# Patient Record
Sex: Female | Born: 1980 | Race: White | Hispanic: No | Marital: Married | Smoking: Never smoker
Health system: Southern US, Community
[De-identification: ages and names within clinical notes are randomized; demographics above are authoritative.]

## PROBLEM LIST (undated history)

## (undated) ENCOUNTER — Inpatient Hospital Stay (HOSPITAL_COMMUNITY): Payer: Self-pay

## (undated) DIAGNOSIS — Z789 Other specified health status: Secondary | ICD-10-CM

## (undated) HISTORY — PX: INNER EAR SURGERY: SHX679

## (undated) HISTORY — PX: WISDOM TOOTH EXTRACTION: SHX21

## (undated) HISTORY — PX: TYMPANOSTOMY TUBE PLACEMENT: SHX32

---

## 2004-05-29 HISTORY — PX: CERVICAL CONE BIOPSY: SUR198

## 2006-07-16 ENCOUNTER — Ambulatory Visit (HOSPITAL_COMMUNITY): Admission: RE | Admit: 2006-07-16 | Discharge: 2006-07-16 | Payer: Self-pay | Admitting: *Deleted

## 2006-07-16 ENCOUNTER — Encounter (INDEPENDENT_AMBULATORY_CARE_PROVIDER_SITE_OTHER): Payer: Self-pay | Admitting: Specialist

## 2009-06-08 ENCOUNTER — Inpatient Hospital Stay (HOSPITAL_COMMUNITY): Admission: AD | Admit: 2009-06-08 | Discharge: 2009-07-22 | Payer: Self-pay | Admitting: Obstetrics

## 2009-06-08 ENCOUNTER — Encounter: Payer: Self-pay | Admitting: Obstetrics

## 2009-06-22 ENCOUNTER — Encounter: Payer: Self-pay | Admitting: Obstetrics

## 2009-07-06 ENCOUNTER — Ambulatory Visit (HOSPITAL_COMMUNITY): Admission: RE | Admit: 2009-07-06 | Discharge: 2009-07-06 | Payer: Self-pay | Admitting: Obstetrics

## 2009-07-20 ENCOUNTER — Encounter: Payer: Self-pay | Admitting: Obstetrics

## 2009-08-12 ENCOUNTER — Observation Stay (HOSPITAL_COMMUNITY): Admission: AD | Admit: 2009-08-12 | Discharge: 2009-08-13 | Payer: Self-pay | Admitting: Obstetrics and Gynecology

## 2009-09-09 ENCOUNTER — Inpatient Hospital Stay (HOSPITAL_COMMUNITY): Admission: AD | Admit: 2009-09-09 | Discharge: 2009-09-11 | Payer: Self-pay | Admitting: Obstetrics and Gynecology

## 2010-08-14 LAB — URINALYSIS, MICROSCOPIC ONLY
Bilirubin Urine: NEGATIVE
Nitrite: NEGATIVE
Protein, ur: NEGATIVE mg/dL
Urobilinogen, UA: 0.2 mg/dL (ref 0.0–1.0)
pH: 6.5 (ref 5.0–8.0)

## 2010-08-14 LAB — CBC
HCT: 36.6 % (ref 36.0–46.0)
HCT: 34.7 % — ABNORMAL LOW (ref 36.0–46.0)
MCHC: 33.4 g/dL (ref 30.0–36.0)
MCHC: 34 g/dL (ref 30.0–36.0)
RBC: 3.64 MIL/uL — ABNORMAL LOW (ref 3.87–5.11)

## 2010-08-14 LAB — DIFFERENTIAL
Eosinophils Relative: 1 % (ref 0–5)
Lymphs Abs: 2 10*3/uL (ref 0.7–4.0)
Monocytes Absolute: 0.5 10*3/uL (ref 0.1–1.0)
Monocytes Relative: 5 % (ref 3–12)
Neutrophils Relative %: 76 % (ref 43–77)

## 2010-08-14 LAB — FETAL FIBRONECTIN: Fetal Fibronectin: NEGATIVE

## 2010-08-14 LAB — STREP B DNA PROBE: Strep Group B Ag: NEGATIVE

## 2010-08-14 LAB — URINE CULTURE: Culture: NO GROWTH

## 2010-08-16 LAB — RH IMMUNE GLOB WKUP(>/=20WKS)(NOT WOMEN'S HOSP): Fetal Screen: NEGATIVE

## 2010-08-16 LAB — CBC
HCT: 32.4 % — ABNORMAL LOW (ref 36.0–46.0)
MCHC: 35.1 g/dL (ref 30.0–36.0)
MCV: 95.2 fL (ref 78.0–100.0)
Platelets: 92 10*3/uL — ABNORMAL LOW (ref 150–400)

## 2010-08-17 LAB — URINALYSIS, ROUTINE W REFLEX MICROSCOPIC
Bilirubin Urine: NEGATIVE
Hgb urine dipstick: NEGATIVE
Specific Gravity, Urine: 1.015 (ref 1.005–1.030)
pH: 7 (ref 5.0–8.0)

## 2010-08-17 LAB — RPR: RPR Ser Ql: NONREACTIVE

## 2010-08-17 LAB — CBC
HCT: 33.2 % — ABNORMAL LOW (ref 36.0–46.0)
Hemoglobin: 11.3 g/dL — ABNORMAL LOW (ref 12.0–15.0)
MCV: 95.1 fL (ref 78.0–100.0)
Platelets: 104 10*3/uL — ABNORMAL LOW (ref 150–400)
RBC: 4.12 MIL/uL (ref 3.87–5.11)
RBC: 3.5 MIL/uL — ABNORMAL LOW (ref 3.87–5.11)
WBC: 10.4 10*3/uL (ref 4.0–10.5)
WBC: 11.1 10*3/uL — ABNORMAL HIGH (ref 4.0–10.5)

## 2010-08-17 LAB — TYPE AND SCREEN
ABO/RH(D): A NEG
Antibody Screen: POSITIVE
DAT, IgG: NEGATIVE

## 2010-08-17 LAB — RH IMMUNE GLOBULIN WORKUP (NOT WOMEN'S HOSP)
ABO/RH(D): A NEG
Antibody Screen: POSITIVE
DAT, IgG: NEGATIVE

## 2010-08-17 LAB — URINE CULTURE
Colony Count: NO GROWTH
Special Requests: NEGATIVE

## 2010-08-21 LAB — DIFFERENTIAL
Basophils Absolute: 0 10*3/uL (ref 0.0–0.1)
Eosinophils Absolute: 0.1 10*3/uL (ref 0.0–0.7)
Lymphocytes Relative: 13 % (ref 12–46)
Monocytes Absolute: 1 10*3/uL (ref 0.1–1.0)
Neutrophils Relative %: 77 % (ref 43–77)

## 2010-08-21 LAB — CBC
HCT: 38.7 % (ref 36.0–46.0)
Hemoglobin: 13 g/dL (ref 12.0–15.0)
MCHC: 33.4 g/dL (ref 30.0–36.0)
MCV: 94.4 fL (ref 78.0–100.0)
MCV: 95.6 fL (ref 78.0–100.0)
Platelets: 152 10*3/uL (ref 150–400)
Platelets: 159 10*3/uL (ref 150–400)
RBC: 3.79 MIL/uL — ABNORMAL LOW (ref 3.87–5.11)
RDW: 13 % (ref 11.5–15.5)
RDW: 13 % (ref 11.5–15.5)
WBC: 12.5 10*3/uL — ABNORMAL HIGH (ref 4.0–10.5)

## 2010-08-21 LAB — URINE CULTURE: Special Requests: NEGATIVE

## 2010-08-21 LAB — TYPE AND SCREEN
ABO/RH(D): A NEG
Antibody Screen: POSITIVE

## 2010-08-21 LAB — URINALYSIS, DIPSTICK ONLY
Bilirubin Urine: NEGATIVE
Nitrite: NEGATIVE
Specific Gravity, Urine: 1.015 (ref 1.005–1.030)
pH: 5.5 (ref 5.0–8.0)

## 2010-08-21 LAB — STREP B DNA PROBE

## 2010-10-14 NOTE — Op Note (Signed)
NAME:  [name], [name]               ACCOUNT NO.:  [id number]   MEDICAL RECORD NO.:  [id number]          PATIENT TYPE:  AMB   LOCATION:  SDC                           FACILITY:  [facility]   PHYSICIAN:  Pe Ell B. Earlene Plater, M.D.  DATE OF BIRTH:  [birth year]   DATE OF PROCEDURE:  07/16/2006  DATE OF DISCHARGE:                               OPERATIVE REPORT   PREOPERATIVE DIAGNOSIS:  Adenocarcinoma in situ of the cervix.   POSTOPERATIVE DIAGNOSIS:  Adenocarcinoma in situ of the cervix.   PROCEDURE:  Cold knife conization of the cervix with endocervical  curettage.   SURGEON:  Chester Holstein. Earlene Plater, M.D.   ASSISTANT:  None.   ANESTHESIA:  LMA general.   SPECIMENS:  Cervical cone and endocervical curettage to pathology.   BLOOD LOSS:  Minimal.   COMPLICATIONS:  None.   INDICATIONS FOR PROCEDURE:  A 30 year old white female gravida 0 who had  an ascus-high grade on Pap.  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  Patient presents for surgical  treatment.  Risks of surgery discussed including infection, bleeding,  damage to surrounding organs and potential increased risk for preterm  birth in the future.  Did discuss with patient recommendations against a  LEEP given the adenocarcinoma in situ concerns, want to avoid thermal  damage.  Patient in agreement.   DESCRIPTION OF PROCEDURE:  Patient taken to the operating room and LMA  anesthesia obtained.  She is prepped and draped in standard fashion.  Bladder emptied with in and out cath.   Weighted speculum inserted into the vagina and the cervix treated with  Lugol's solution.  The transition zone did not stain.  The remainder of  the cervix stained normally.  Stay sutures placed at 3 and 9 o'clock.  The uterus was sounded and a cone shaped specimen excised with knife and  curved scissors.  The specimen was tagged at 12 o'clock for  identification.  Gentle endocervical curettage performed, minimal tissue   obtained.   The cone bed was made hemostatic with ball cautery and Monsel's  solution.  Gelfoam was placed in the cone bed.   The instruments were removed and cervix hemostatic.  Patient tolerated  the procedure well with no complications.  She was taken to the recovery  room awake, alert and in stable condition.      Gerri Spore B. Earlene Plater, M.D.  Electronically Signed     WBD/MEDQ  D:  07/16/2006  T:  07/16/2006  Job:  [id number]

## 2010-11-22 ENCOUNTER — Other Ambulatory Visit: Payer: Self-pay | Admitting: Obstetrics and Gynecology

## 2013-05-29 NOTE — L&D Delivery Note (Signed)
Delivery Note At 11:36 AM a viable and healthy female was delivered via  (Presentation:LOA ).  APGAR: 8, 9; weight pending.  Placenta status:manually and intact.  Cord:  with the following complications: tight [place] x one reduced on perineum .  Cord pH: na  Anesthesia:  epidural Episiotomy: none Lacerations: none Suture Repair: na Est. Blood Loss (mL): 200  Mom to postpartum.  Baby to Couplet care / Skin to Skin.  Nihar Klus J 12/15/2013, 11:50 AM

## 2013-07-02 LAB — OB RESULTS CONSOLE HIV ANTIBODY (ROUTINE TESTING): HIV: NONREACTIVE

## 2013-07-02 LAB — OB RESULTS CONSOLE ABO/RH: RH Type: NEGATIVE

## 2013-07-02 LAB — OB RESULTS CONSOLE RPR: RPR: NONREACTIVE

## 2013-07-02 LAB — OB RESULTS CONSOLE RUBELLA ANTIBODY, IGM: Rubella: IMMUNE

## 2013-07-02 LAB — OB RESULTS CONSOLE ANTIBODY SCREEN: ANTIBODY SCREEN: POSITIVE

## 2013-07-02 LAB — OB RESULTS CONSOLE HEPATITIS B SURFACE ANTIGEN: Hepatitis B Surface Ag: NEGATIVE

## 2013-07-10 LAB — OB RESULTS CONSOLE GC/CHLAMYDIA
CHLAMYDIA, DNA PROBE: NEGATIVE
GC PROBE AMP, GENITAL: NEGATIVE

## 2013-10-19 ENCOUNTER — Inpatient Hospital Stay (HOSPITAL_COMMUNITY): Payer: BC Managed Care – PPO

## 2013-10-19 ENCOUNTER — Observation Stay (HOSPITAL_COMMUNITY)
Admission: AD | Admit: 2013-10-19 | Discharge: 2013-10-19 | Disposition: A | Discharge status: Home / Self Care | Payer: BC Managed Care – PPO | Source: Ambulatory Visit | Attending: Obstetrics and Gynecology | Admitting: Obstetrics and Gynecology

## 2013-10-19 ENCOUNTER — Encounter (HOSPITAL_COMMUNITY): Payer: Self-pay | Admitting: *Deleted

## 2013-10-19 DIAGNOSIS — O479 False labor, unspecified: Secondary | ICD-10-CM

## 2013-10-19 DIAGNOSIS — O26879 Cervical shortening, unspecified trimester: Principal | ICD-10-CM | POA: Insufficient documentation

## 2013-10-19 DIAGNOSIS — O4700 False labor before 37 completed weeks of gestation, unspecified trimester: Secondary | ICD-10-CM | POA: Insufficient documentation

## 2013-10-19 DIAGNOSIS — O26872 Cervical shortening, second trimester: Secondary | ICD-10-CM | POA: Diagnosis present

## 2013-10-19 LAB — FETAL FIBRONECTIN: Fetal Fibronectin: NEGATIVE

## 2013-10-19 LAB — URINALYSIS, ROUTINE W REFLEX MICROSCOPIC
BILIRUBIN URINE: NEGATIVE
GLUCOSE, UA: NEGATIVE mg/dL
Hgb urine dipstick: NEGATIVE
KETONES UR: NEGATIVE mg/dL
LEUKOCYTES UA: NEGATIVE
Nitrite: NEGATIVE
PH: 7.5 (ref 5.0–8.0)
Protein, ur: NEGATIVE mg/dL
Specific Gravity, Urine: 1.01 (ref 1.005–1.030)
Urobilinogen, UA: 0.2 mg/dL (ref 0.0–1.0)

## 2013-10-19 LAB — CBC
HCT: 36.8 % (ref 36.0–46.0)
HEMOGLOBIN: 12.6 g/dL (ref 12.0–15.0)
MCH: 31.3 pg (ref 26.0–34.0)
MCHC: 34.2 g/dL (ref 30.0–36.0)
MCV: 91.5 fL (ref 78.0–100.0)
PLATELETS: 146 10*3/uL — AB (ref 150–400)
RBC: 4.02 MIL/uL (ref 3.87–5.11)
RDW: 13 % (ref 11.5–15.5)
WBC: 10.3 10*3/uL (ref 4.0–10.5)

## 2013-10-19 MED ORDER — PRENATAL PLUS 27-1 MG PO TABS
1.0000 | ORAL_TABLET | Freq: Every day | ORAL | Status: DC
  Administered 2013-10-19: 1 via ORAL
  Filled 2013-10-19: qty 1

## 2013-10-19 MED ORDER — ACETAMINOPHEN 325 MG PO TABS: 650.0000 mg | ORAL_TABLET | ORAL | Status: DC | PRN

## 2013-10-19 MED ORDER — LACTATED RINGERS IV BOLUS (SEPSIS): 500.0000 mL | Freq: Once | INTRAVENOUS | Status: DC

## 2013-10-19 MED ORDER — ZOLPIDEM TARTRATE 5 MG PO TABS: 5.0000 mg | ORAL_TABLET | Freq: Every evening | ORAL | Status: DC | PRN

## 2013-10-19 MED ORDER — NIFEDIPINE 10 MG PO CAPS
10.0000 mg | ORAL_CAPSULE | Freq: Four times a day (QID) | ORAL | Status: DC
  Filled 2013-10-19: qty 1

## 2013-10-19 MED ORDER — LACTATED RINGERS IV SOLN
INTRAVENOUS | Status: DC
  Administered 2013-10-19 (×2): via INTRAVENOUS

## 2013-10-19 MED ORDER — DOCUSATE SODIUM 100 MG PO CAPS
100.0000 mg | ORAL_CAPSULE | Freq: Every day | ORAL | Status: DC
  Filled 2013-10-19 (×2): qty 1

## 2013-10-19 MED ORDER — PROGESTERONE MICRONIZED 200 MG PO CAPS
200.0000 mg | ORAL_CAPSULE | Freq: Every day | ORAL | Status: DC
  Administered 2013-10-19: 200 mg via VAGINAL
  Filled 2013-10-19 (×2): qty 1

## 2013-10-19 MED ORDER — CALCIUM CARBONATE ANTACID 500 MG PO CHEW
2.0000 | CHEWABLE_TABLET | ORAL | Status: DC | PRN
  Filled 2013-10-19: qty 2

## 2013-10-19 MED ORDER — NIFEDIPINE 10 MG PO CAPS
20.0000 mg | ORAL_CAPSULE | Freq: Once | ORAL | Status: AC
  Administered 2013-10-19: 20 mg via ORAL
  Filled 2013-10-19: qty 2

## 2013-10-19 MED ORDER — NIFEDIPINE 10 MG PO CAPS
10.0000 mg | ORAL_CAPSULE | Freq: Four times a day (QID) | ORAL | Status: DC
Start: 2013-10-19 — End: 2013-12-14

## 2013-10-19 NOTE — MAU Provider Note (Signed)
Reviewed and agree.

## 2013-10-19 NOTE — MAU Note (Signed)
Reports contractions that started around 4 pm, about 4 UC an hour until 10-11pm, then had 6 UC in 30 minutes. Hx of shorten cervical length, 2.3cm on Wednesday. Taking progesterone suppositories. +FM. Denies vaginal bleeding, abnormal discharge and urinary symptoms.

## 2013-10-19 NOTE — H&P (Signed)
Will defer BMZ due to early gestational age/less pulm tissue. Will do if cervical issue worsens. Also defer Magnesium sulfate for neuroprotection due to contractions pattern are not close but observation warranted due to cervical funneling

## 2013-10-19 NOTE — MAU Provider Note (Signed)
  History     CSN: [id number]  Arrival date and time: 10/19/13 0004 RN notified provider of pt arrival @0034  Provider at bedside @0155     Chief Complaint  Patient presents with  . Contractions   HPI Comments: [id number] @25 .6 wks c/o ctx since 1600 today. Ctx became more regular @2300 , 4-5 over 10 min. No LOF or VB. Denies sexual intercourse. Admits to increased activity since early this am. Pregnancy complicated by cervical shortening @21  wks, last cervical length 4 days ago 2.5cm w/o funneling. Taking vaginal Prometrium.   OB History   Grav Para Term Preterm Abortions TAB SAB Ect Mult Living   4 2 2  0 1  1  1 3       History reviewed. No pertinent past medical history.  Past Surgical History  Procedure Laterality Date  . Wisdom tooth extraction    . Cervical cone biopsy  2006  . Tympanostomy tube placement      History reviewed. No pertinent family history.  History  Substance Use Topics  . Smoking status: Never Smoker   . Smokeless tobacco: Not on file  . Alcohol Use: No    Allergies: No Known Allergies  Prescriptions prior to admission  Medication Sig Dispense Refill  . Prenatal Vit-Fe Fumarate-FA (MULTIVITAMIN-PRENATAL) 27-0.8 MG TABS tablet Take 1 tablet by mouth daily at 12 noon.      . progesterone (PROMETRIUM) 200 MG capsule Place 200 mg vaginally at bedtime.        Review of Systems  Constitutional: Negative.   HENT: Negative.   Eyes: Negative.   Respiratory: Negative.   Cardiovascular: Negative.   Gastrointestinal: Negative.   Genitourinary: Negative for dysuria, urgency, frequency, hematuria and flank pain.       Good FM.  No LOF or VB. +ctx  Musculoskeletal: Negative.   Skin: Negative.   Neurological: Negative.   Endo/Heme/Allergies: Negative.   Psychiatric/Behavioral: Negative.    Physical Exam   Blood pressure 121/59, pulse 72, temperature 98.2 F (36.8 C), temperature source Oral, resp. rate 18, height 5\' 7"  (1.702 m), weight 71.668 kg  (158 lb), SpO2 99.00%.  Physical Exam  Constitutional: She is oriented to person, place, and time. She appears well-developed and well-nourished.  HENT:  Head: Normocephalic.  Neck: Normal range of motion.  Cardiovascular: Normal rate and regular rhythm.   Respiratory: Effort normal and breath sounds normal.  GI: Soft. Bowel sounds are normal.  Genitourinary: Vagina normal.  SVE 0/50/-3  Musculoskeletal: Normal range of motion.  Neurological: She is alert and oriented to person, place, and time.  Skin: Skin is warm and dry.  Psychiatric: She has a normal mood and affect.  EFM: 125 bpm, mod variability, +accels, no decels Toco: q15   UA: WNL FFN:Neg MAU Course  Procedures   Assessment and Plan  25.[redacted] weeks gestation Cervical shortening with funneling Preterm contractions  Observation to Ante, Procardia q6 hrs, continue Prometrium, IV hydration, monitor uterine activity. Discussed assessment and plan with Dr. Cherly Hensen.   Donette Larry, CNM 10/19/13, [phone]

## 2013-10-19 NOTE — Progress Notes (Signed)
Very active fetus--can visualize fetal movement--tracing maternal HR at times

## 2013-10-19 NOTE — Progress Notes (Signed)
HD #2  25 6/7 week  S; notes some mild ctx  O: VS BP 112/55 T98.4 Lungs: clear to A Abdomen: gravid, nontender Extr: no edema or calf tenderness VE: flared cervix/2 cm/-3 vtx  Tracing: baseline 130-135 (+) accels Initial run of ctx then rare  IMP: preterm cervical shortening IUP @ 25 6/7 PTL P) cont procardia. Cont vaginal progesterone suppository. PTL prec/ OOW until appt this week with Dr Billy Coast Repeat sono nv Bedrest, d/c home

## 2013-10-19 NOTE — Discharge Summary (Signed)
Obstetric Discharge Summary Reason for Admission: observation/evaluation and PMC, shortened cervix, IUP @ 25 5/7 weeks Discharge diagnosis: PMC, PTL, IUP @ 25 6/7 weeks undelivered Prenatal Procedures: ultrasound Intrapartum Procedures: none Postpartum Procedures: n/a Complications-Operative and Postpartum: n/a Hemoglobin  Date Value Ref Range Status  10/19/2013 12.6  12.0 - 15.0 g/dL Final     HCT  Date Value Ref Range Status  10/19/2013 36.8  36.0 - 46.0 % Final    Physical Exam:  General: alert, cooperative and no distress Lungs clear to A Abdomen: gravid nontender Pelvic; closed/flared/2 cm firm/pp out of pelvis Extr: no edema  Hospital course: pt was placed under observation due to funnelling in setting of shortened cervix on sonogram. Pt was started on procardia. She was continued on vaginal progesterone. Pt was placed on continuous fetal monitoring.  She was discharged home on bed rest. Reactive NST noted. FFN neg.  Discharge Diagnoses: Premature labor and IUP@ 25 6/7 weeks  Discharge Information: Date: 10/19/2013 Activity: pelvic rest Diet: routine Medications: PNV and procardia, continue vaginal progesterone Condition: stable Instructions: PTL precautions, bed rest, OOW until reevaluation Discharge to: home Follow-up Information   Follow up with Lenoard Aden, MD On 10/21/2013.   Specialty:  Obstetrics and Gynecology   Contact information:   [address] [place] [address] [phone]       Newborn Data: Undelivered  [name] 10/19/2013, 12:09 PM

## 2013-10-19 NOTE — Discharge Instructions (Signed)
Preterm Labor Information Preterm labor is when labor starts at less than 37 weeks of pregnancy. The normal length of a pregnancy is 39 to 41 weeks. CAUSES Often, there is no identifiable underlying cause as to why a woman goes into preterm labor. One of the most common known causes of preterm labor is infection. Infections of the uterus, cervix, vagina, amniotic sac, bladder, kidney, or even the lungs (pneumonia) can cause labor to start. Other suspected causes of preterm labor include:   Urogenital infections, such as yeast infections and bacterial vaginosis.   Uterine abnormalities (uterine shape, uterine septum, fibroids, or bleeding from the placenta).   A cervix that has been operated on (it may fail to stay closed).   Malformations in the fetus.   Multiple gestations (twins, triplets, and so on).   Breakage of the amniotic sac.  RISK FACTORS  Having a previous history of preterm labor.   Having premature rupture of membranes (PROM).   Having a placenta that covers the opening of the cervix (placenta previa).   Having a placenta that separates from the uterus (placental abruption).   Having a cervix that is too weak to hold the fetus in the uterus (incompetent cervix).   Having too much fluid in the amniotic sac (polyhydramnios).   Taking illegal drugs or smoking while pregnant.   Not gaining enough weight while pregnant.   Being younger than 30 and older than 32 years old.   Having a low socioeconomic status.   Being African American. SYMPTOMS Signs and symptoms of preterm labor include:   Menstrual-like cramps, abdominal pain, or back pain.  Uterine contractions that are regular, as frequent as six in an hour, regardless of their intensity (may be mild or painful).  Contractions that start on the top of the uterus and spread down to the lower abdomen and back.   A sense of increased pelvic pressure.   A watery or bloody mucus discharge that  comes from the vagina.  TREATMENT Depending on the length of the pregnancy and other circumstances, your health care provider may suggest bed rest. If necessary, there are medicines that can be given to stop contractions and to mature the fetal lungs. If labor happens before 34 weeks of pregnancy, a prolonged hospital stay may be recommended. Treatment depends on the condition of both you and the fetus.  WHAT SHOULD YOU DO IF YOU THINK YOU ARE IN PRETERM LABOR? Call your health care provider right away. You will need to go to the hospital to get checked immediately. HOW CAN YOU PREVENT PRETERM LABOR IN FUTURE PREGNANCIES? You should:   Stop smoking if you smoke.  Maintain healthy weight gain and avoid chemicals and drugs that are not necessary.  Be watchful for any type of infection.  Inform your health care provider if you have a known history of preterm labor. Document Released: 08/05/2003 Document Revised: 01/15/2013 Document Reviewed: 06/17/2012 [facility] Patient Information 2014 [place], [place].  Pelvic Rest Pelvic rest is sometimes recommended for women when:   The placenta is partially or completely covering the opening of the cervix (placenta previa).  There is bleeding between the uterine wall and the amniotic sac in the first trimester (subchorionic hemorrhage).  The cervix begins to open without labor starting (incompetent cervix, cervical insufficiency).  The labor is too early (preterm labor). HOME CARE INSTRUCTIONS  Do not have sexual intercourse, stimulation, or an orgasm.  Do not use tampons, douche, or put anything in the vagina.  Do not  lift anything over 10 pounds (4.5 kg).  Avoid strenuous activity or straining your pelvic muscles. SEEK MEDICAL CARE IF:  You have any vaginal bleeding during pregnancy. Treat this as a potential emergency.  You have cramping pain felt low in the stomach (stronger than menstrual cramps).  You notice vaginal discharge  (watery, mucus, or bloody).  You have a low, dull backache.  There are regular contractions or uterine tightening. SEEK IMMEDIATE MEDICAL CARE IF: You have vaginal bleeding and have placenta previa.  Document Released: 09/09/2010 Document Revised: 08/07/2011 Document Reviewed: 09/09/2010 [facility] Patient Information 2014 [place], [place].  Fetal Fibronectin This is a test done to help evaluate a pregnant woman's risk of pre-term delivery. It is generally done when you are 26 to [redacted] weeks pregnant and are having symptoms of premature labor. A Dacron swab is used to take a sample of cervical or vaginal fluid from the back portion of the vagina or from the area just outside the opening of the cervix. Fetal fibronectin (fFN) is a glycoprotein that can be used to help predict the short term risk of premature delivery. fFN is produced at the boundary between the amniotic sac and the lining of the mother's uterus. This is called the unteroplacental junction. Fetal fibronectin is largely confined to this junction and thought to help maintain the integrity of the boundary. fFN is normally detectable in cervicovaginal fluid during the first 20 to 24 weeks of pregnancy, and then is detectable again after about 36 weeks.  Finding fFN in cervicovaginal fluids after 36 weeks is not unusual as it is often released by the body as it gets ready for childbirth. The elevated fFN found in vaginal fluids early in pregnancy may simply reflect the normal growth and establishment of tissues at the unteroplacental junction with levels falling when this phase is complete. What is known is that fFN that is detected between 24 and 36 weeks of pregnancy is not normal. Elevated levels reflect a disturbance at the uteroplacental junction and have been associated with an increased risk of pre-term labor and delivery. Knowing whether or not a woman is likely to deliver prematurely helps your caregiver plan a course of action. The fFN  test is a relatively non-invasive tool to help the caregiver to distinguish between those who are likely to deliver shortly and those who are not.  PREPARATION FOR TEST   Inform the person conducting the test if you have a medical condition or are using any medications that cause excessive bleeding.  Do not have sexual intercourse for 24 hours before the procedure. NORMAL FINDINGS  Pregnancy = 50 nanograms/ml Ranges for normal findings may vary among different laboratories and hospitals. You should always check with your doctor after having lab work or other tests done to discuss the meaning of your test results and whether your values are considered within normal limits. MEANING OF TEST  Your caregiver will go over the test results with you and discuss the importance and meaning of your results, as well as treatment options and the need for additional tests if necessary. OBTAINING THE TEST RESULTS  It is your responsibility to obtain your test results. Ask the lab or department performing the test when and how you will get your results. Document Released: 03/16/2004 Document Revised: 08/07/2011 Document Reviewed: 04/24/2008 [facility] Patient Information 2014 [place], [place].

## 2013-10-19 NOTE — Progress Notes (Signed)
Bhambri CNM notified patient arrived with c/o of UC since 4pm. Hx of shorten cervical length, 2.3cm on Wednesday. Urinalysis processing. Orders received to PO hydrate, collect FFN and send to u/s for cervical length.

## 2013-10-19 NOTE — H&P (Signed)
  OB ADMISSION/ HISTORY & PHYSICAL:  Admission Date: 10/19/2013 12:04 AM  Admit Diagnosis: 25.[redacted] weeks gestation, cervical shortening, preterm contractions  [name] is a 33 y.o. female [phone] presenting with ctx since 1600 today. Ctx became more regular @2300 , 4-5 over 10 min. No LOF or VB. Denies sexual intercourse. Admits to increased activity since early this am.  .  Prenatal History: [id number]   EDC:01/26/2014, by Other Basis   Prenatal care at [facility]  Primary Ob Provider: Dr. Billy Coast Prenatal course complicated by first trimester VB with large [facility] resolved by 20 wks, cervical shortening @21  wks, last CL 4 days ago 2.5cm w/o funneling, on vaginal Prometrium. H/o CKC 2008, cervical shortening with last pregnancy-SVD @37  wks (twins)  Prenatal Labs: ABO, Rh:   A Neg Antibody:  Neg Rubella:   Immune RPR:   NR HBsAg:   Neg HIV:   Neg GBS:   n/a 1 hr GTT : n/a  Medical / Surgical History :  Past medical history: History reviewed. No pertinent past medical history.   Past surgical history:  Past Surgical History  Procedure Laterality Date  . Wisdom tooth extraction    . Cervical cone biopsy  2006  . Tympanostomy tube placement      Family History: History reviewed. No pertinent family history.   Social History:  reports that she has never smoked. She does not have any smokeless tobacco history on file. She reports that she does not drink alcohol or use illicit drugs.  Allergies: Review of patient's allergies indicates no known allergies.   Current Medications at time of admission:  Prior to Admission medications   Medication Sig Start Date End Date Taking? Authorizing Provider  Prenatal Vit-Fe Fumarate-FA (MULTIVITAMIN-PRENATAL) 27-0.8 MG TABS tablet Take 1 tablet by mouth daily at 12 noon.   Yes Historical Provider, MD  progesterone (PROMETRIUM) 200 MG capsule Place 200 mg vaginally at bedtime.   Yes Historical Provider, MD     Review of  Systems: +FM +ctx No LOF No VB  Physical Exam:  VS: Blood pressure 121/59, pulse 72, temperature 98.2 F (36.8 C), temperature source Oral, resp. rate 18, height 5\' 7"  (1.702 m), weight 71.668 kg (158 lb), SpO2 99.00%.  General: alert and oriented, appears comfortable Heart: RRR Lungs: Clear lung fields Abdomen: Gravid, soft and non-tender, non-distended  Extremities: no edema Genitalia / VE: 0/50/-3  FHR: baseline rate 125 / variability mod / accelerations + / no decelerations TOCO: q15   Assessment: 25.[redacted] weeks gestation Cervical shortening with funneling Preterm contractions FHR category I   Plan:  Observation, IV hydration, Procardia, continue Prometrium, monitor uterine activity. Dr. Cherly Hensen notified of admission / plan of care   Lawernce Pitts MSN, CNM 10/19/2013, 2:54 AM

## 2013-12-14 ENCOUNTER — Inpatient Hospital Stay (HOSPITAL_COMMUNITY)
Admission: AD | Admit: 2013-12-14 | Discharge: 2013-12-17 | DRG: 775 | Disposition: A | Discharge status: Home / Self Care | Payer: BC Managed Care – PPO | Source: Ambulatory Visit | Attending: Obstetrics and Gynecology | Admitting: Obstetrics and Gynecology

## 2013-12-14 ENCOUNTER — Encounter (HOSPITAL_COMMUNITY): Payer: Self-pay | Admitting: *Deleted

## 2013-12-14 ENCOUNTER — Inpatient Hospital Stay (HOSPITAL_COMMUNITY): Payer: BC Managed Care – PPO

## 2013-12-14 DIAGNOSIS — O4290 Premature rupture of membranes, unspecified as to length of time between rupture and onset of labor, unspecified weeks of gestation: Principal | ICD-10-CM | POA: Diagnosis present

## 2013-12-14 DIAGNOSIS — O42919 Preterm premature rupture of membranes, unspecified as to length of time between rupture and onset of labor, unspecified trimester: Secondary | ICD-10-CM | POA: Diagnosis present

## 2013-12-14 HISTORY — DX: Other specified health status: Z78.9

## 2013-12-14 LAB — CBC
HCT: 37.3 % (ref 36.0–46.0)
HEMOGLOBIN: 12.8 g/dL (ref 12.0–15.0)
MCH: 31.6 pg (ref 26.0–34.0)
MCHC: 34.3 g/dL (ref 30.0–36.0)
MCV: 92.1 fL (ref 78.0–100.0)
Platelets: 162 10*3/uL (ref 150–400)
RBC: 4.05 MIL/uL (ref 3.87–5.11)
RDW: 13.4 % (ref 11.5–15.5)
WBC: 13.4 10*3/uL — ABNORMAL HIGH (ref 4.0–10.5)

## 2013-12-14 LAB — OB RESULTS CONSOLE GBS: GBS: NEGATIVE

## 2013-12-14 LAB — GROUP B STREP BY PCR: GROUP B STREP BY PCR: NEGATIVE

## 2013-12-14 MED ORDER — ZOLPIDEM TARTRATE 5 MG PO TABS: 5.0000 mg | ORAL_TABLET | Freq: Every evening | ORAL | Status: DC | PRN

## 2013-12-14 MED ORDER — CITRIC ACID-SODIUM CITRATE 334-500 MG/5ML PO SOLN
30.0000 mL | ORAL | Status: DC | PRN
Start: 2013-12-14 — End: 2013-12-15

## 2013-12-14 MED ORDER — FLEET ENEMA 7-19 GM/118ML RE ENEM: 1.0000 | ENEMA | RECTAL | Status: DC | PRN

## 2013-12-14 MED ORDER — OXYCODONE-ACETAMINOPHEN 5-325 MG PO TABS: 1.0000 | ORAL_TABLET | ORAL | Status: DC | PRN

## 2013-12-14 MED ORDER — LACTATED RINGERS IV SOLN: 500.0000 mL | INTRAVENOUS | Status: DC | PRN

## 2013-12-14 MED ORDER — DIPHENHYDRAMINE HCL 50 MG/ML IJ SOLN: 12.5000 mg | INTRAMUSCULAR | Status: DC | PRN

## 2013-12-14 MED ORDER — LIDOCAINE HCL (PF) 1 % IJ SOLN
30.0000 mL | INTRAMUSCULAR | Status: DC | PRN
  Filled 2013-12-14: qty 30

## 2013-12-14 MED ORDER — FENTANYL 2.5 MCG/ML BUPIVACAINE 1/10 % EPIDURAL INFUSION (WH - ANES)
14.0000 mL/h | INTRAMUSCULAR | Status: DC | PRN
  Administered 2013-12-15: 14 mL/h via EPIDURAL

## 2013-12-14 MED ORDER — TERBUTALINE SULFATE 1 MG/ML IJ SOLN: 0.2500 mg | Freq: Once | INTRAMUSCULAR | Status: AC | PRN

## 2013-12-14 MED ORDER — LACTATED RINGERS IV SOLN
500.0000 mL | Freq: Once | INTRAVENOUS | Status: AC
  Administered 2013-12-15: 1000 mL via INTRAVENOUS

## 2013-12-14 MED ORDER — PHENYLEPHRINE 40 MCG/ML (10ML) SYRINGE FOR IV PUSH (FOR BLOOD PRESSURE SUPPORT)
80.0000 ug | PREFILLED_SYRINGE | INTRAVENOUS | Status: DC | PRN
  Filled 2013-12-14: qty 2

## 2013-12-14 MED ORDER — LACTATED RINGERS IV SOLN
INTRAVENOUS | Status: DC
  Administered 2013-12-14 – 2013-12-15 (×3): via INTRAVENOUS

## 2013-12-14 MED ORDER — OXYTOCIN 40 UNITS IN LACTATED RINGERS INFUSION - SIMPLE MED
1.0000 m[IU]/min | INTRAVENOUS | Status: DC
  Administered 2013-12-15: 2 m[IU]/min via INTRAVENOUS
  Filled 2013-12-14: qty 1000

## 2013-12-14 MED ORDER — ACETAMINOPHEN 325 MG PO TABS: 650.0000 mg | ORAL_TABLET | ORAL | Status: DC | PRN

## 2013-12-14 MED ORDER — IBUPROFEN 600 MG PO TABS
600.0000 mg | ORAL_TABLET | Freq: Four times a day (QID) | ORAL | Status: DC | PRN
Start: 2013-12-14 — End: 2013-12-15

## 2013-12-14 MED ORDER — ONDANSETRON HCL 4 MG/2ML IJ SOLN: 4.0000 mg | Freq: Four times a day (QID) | INTRAMUSCULAR | Status: DC | PRN

## 2013-12-14 MED ORDER — OXYTOCIN 40 UNITS IN LACTATED RINGERS INFUSION - SIMPLE MED
62.5000 mL/h | INTRAVENOUS | Status: DC
  Administered 2013-12-15: 62.5 mL/h via INTRAVENOUS

## 2013-12-14 MED ORDER — OXYTOCIN BOLUS FROM INFUSION: 500.0000 mL | INTRAVENOUS | Status: DC

## 2013-12-14 MED ORDER — EPHEDRINE 5 MG/ML INJ
10.0000 mg | INTRAVENOUS | Status: DC | PRN
  Filled 2013-12-14: qty 2

## 2013-12-14 NOTE — MAU Note (Signed)
Pt states water broke at 8pm-clear fluid. Has hx of PTL. Baby moving well. Denies vaginal bleeding

## 2013-12-14 NOTE — H&P (Signed)
[name] is a 33 y.o. female presenting for SROM at [id number]. Maternal Medical History:  Reason for admission: Rupture of membranes.   Contractions: Onset was less than 1 hour ago.   Frequency: rare.   Perceived severity is mild.    Fetal activity: Perceived fetal activity is normal.   Last perceived fetal movement was within the past hour.    Prenatal complications: Preterm labor.   Prenatal Complications - Diabetes: none.    OB History   Grav Para Term Preterm Abortions TAB SAB Ect Mult Living   4 2 2  0 1  1  1 3      Past Medical History  Diagnosis Date  . Medical history non-contributory    Past Surgical History  Procedure Laterality Date  . Wisdom tooth extraction    . Cervical cone biopsy  2006  . Tympanostomy tube placement     Family History: family history is not on file. Social History:  reports that she has never smoked. She has never used smokeless tobacco. She reports that she does not drink alcohol or use illicit drugs.   Prenatal Transfer Tool  Maternal Diabetes: No Genetic Screening: Normal Maternal Ultrasounds/Referrals: Normal Fetal Ultrasounds or other Referrals:  None Maternal Substance Abuse:  No Significant Maternal Medications:  None Significant Maternal Lab Results:  None Other Comments:  None  Review of Systems  All other systems reviewed and are negative.   Dilation: 2 Exam by:: Dr Billy Coastaavon Blood pressure 119/62, pulse 88, temperature 98.5 F (36.9 C), temperature source Oral, resp. rate 18, height 5\' 6"  (1.676 m), weight 74.571 kg (164 lb 6.4 oz), SpO2 100.00%. Maternal Exam:  Uterine Assessment: Contraction strength is mild.  Contraction frequency is rare.   Abdomen: Patient reports no abdominal tenderness. Fetal presentation: vertex  Introitus: Normal vulva. Normal vagina.  Ferning test: positive.  Nitrazine test: not done. Amniotic fluid character: clear.  Pelvis: adequate for delivery.   Cervix: Cervix evaluated by sterile  speculum exam and digital exam.     Physical Exam  Constitutional: She is oriented to person, place, and time. She appears well-developed and well-nourished.  HENT:  Head: Normocephalic and atraumatic.  Neck: Normal range of motion. Neck supple.  Cardiovascular: Normal rate and regular rhythm.   Respiratory: Effort normal and breath sounds normal.  GI: Soft. Bowel sounds are normal.  Genitourinary: Vagina normal and uterus normal.  Musculoskeletal: Normal range of motion.  Neurological: She is alert and oriented to person, place, and time.  Skin: Skin is warm and dry.  Psychiatric: She has a normal mood and affect. Her behavior is normal. Judgment and thought content normal.    Prenatal labs: ABO, Rh:   Antibody:   Rubella:   RPR:    HBsAg:    HIV:    GBS:     Assessment/Plan: PPROM at 33 6/7 weeks BMZ complete Will do sono to ck EFW Rapid GBS IOL at 34 weeks.   Manpreet Kemmer J 12/14/2013, 9:16 PM

## 2013-12-15 ENCOUNTER — Encounter (HOSPITAL_COMMUNITY): Payer: BC Managed Care – PPO | Admitting: Anesthesiology

## 2013-12-15 ENCOUNTER — Encounter (HOSPITAL_COMMUNITY): Payer: Self-pay | Admitting: *Deleted

## 2013-12-15 ENCOUNTER — Inpatient Hospital Stay (HOSPITAL_COMMUNITY): Payer: BC Managed Care – PPO | Admitting: Anesthesiology

## 2013-12-15 LAB — PREPARE RBC (CROSSMATCH)

## 2013-12-15 LAB — RPR

## 2013-12-15 MED ORDER — DIPHENHYDRAMINE HCL 25 MG PO CAPS: 25.0000 mg | ORAL_CAPSULE | Freq: Four times a day (QID) | ORAL | Status: DC | PRN

## 2013-12-15 MED ORDER — METHYLERGONOVINE MALEATE 0.2 MG PO TABS: 0.2000 mg | ORAL_TABLET | ORAL | Status: DC | PRN

## 2013-12-15 MED ORDER — OXYCODONE-ACETAMINOPHEN 5-325 MG PO TABS
1.0000 | ORAL_TABLET | ORAL | Status: DC | PRN
  Administered 2013-12-15 – 2013-12-17 (×11): 1 via ORAL
  Filled 2013-12-15: qty 2
  Filled 2013-12-15 (×10): qty 1

## 2013-12-15 MED ORDER — PRENATAL MULTIVITAMIN CH
1.0000 | ORAL_TABLET | Freq: Every day | ORAL | Status: DC
  Administered 2013-12-16: 1 via ORAL
  Filled 2013-12-15 (×2): qty 1

## 2013-12-15 MED ORDER — ONDANSETRON HCL 4 MG/2ML IJ SOLN: 4.0000 mg | INTRAMUSCULAR | Status: DC | PRN

## 2013-12-15 MED ORDER — DIBUCAINE 1 % RE OINT: 1.0000 "application " | TOPICAL_OINTMENT | RECTAL | Status: DC | PRN

## 2013-12-15 MED ORDER — SIMETHICONE 80 MG PO CHEW: 80.0000 mg | CHEWABLE_TABLET | ORAL | Status: DC | PRN

## 2013-12-15 MED ORDER — FENTANYL 2.5 MCG/ML BUPIVACAINE 1/10 % EPIDURAL INFUSION (WH - ANES)
INTRAMUSCULAR | Status: AC
  Administered 2013-12-15: 1 mL/h via EPIDURAL
  Filled 2013-12-15: qty 125

## 2013-12-15 MED ORDER — METHYLERGONOVINE MALEATE 0.2 MG/ML IJ SOLN: 0.2000 mg | INTRAMUSCULAR | Status: DC | PRN

## 2013-12-15 MED ORDER — SENNOSIDES-DOCUSATE SODIUM 8.6-50 MG PO TABS
2.0000 | ORAL_TABLET | ORAL | Status: DC
  Administered 2013-12-15 – 2013-12-16 (×2): 2 via ORAL
  Filled 2013-12-15 (×2): qty 2

## 2013-12-15 MED ORDER — PHENYLEPHRINE 40 MCG/ML (10ML) SYRINGE FOR IV PUSH (FOR BLOOD PRESSURE SUPPORT)
PREFILLED_SYRINGE | INTRAVENOUS | Status: AC
  Filled 2013-12-15: qty 10

## 2013-12-15 MED ORDER — ZOLPIDEM TARTRATE 5 MG PO TABS: 5.0000 mg | ORAL_TABLET | Freq: Every evening | ORAL | Status: DC | PRN

## 2013-12-15 MED ORDER — TETANUS-DIPHTH-ACELL PERTUSSIS 5-2.5-18.5 LF-MCG/0.5 IM SUSP: 0.5000 mL | Freq: Once | INTRAMUSCULAR | Status: DC

## 2013-12-15 MED ORDER — IBUPROFEN 600 MG PO TABS
600.0000 mg | ORAL_TABLET | Freq: Four times a day (QID) | ORAL | Status: DC
  Administered 2013-12-15 – 2013-12-17 (×9): 600 mg via ORAL
  Filled 2013-12-15 (×9): qty 1

## 2013-12-15 MED ORDER — LIDOCAINE HCL (PF) 1 % IJ SOLN
INTRAMUSCULAR | Status: DC | PRN
  Administered 2013-12-15: 10 mL

## 2013-12-15 MED ORDER — FENTANYL 2.5 MCG/ML BUPIVACAINE 1/10 % EPIDURAL INFUSION (WH - ANES): 14.0000 mL/h | INTRAMUSCULAR | Status: DC | PRN

## 2013-12-15 MED ORDER — LANOLIN HYDROUS EX OINT
TOPICAL_OINTMENT | CUTANEOUS | Status: DC | PRN
Start: 2013-12-15 — End: 2013-12-17

## 2013-12-15 MED ORDER — ONDANSETRON HCL 4 MG PO TABS: 4.0000 mg | ORAL_TABLET | ORAL | Status: DC | PRN

## 2013-12-15 MED ORDER — WITCH HAZEL-GLYCERIN EX PADS: 1.0000 "application " | MEDICATED_PAD | CUTANEOUS | Status: DC | PRN

## 2013-12-15 MED ORDER — BENZOCAINE-MENTHOL 20-0.5 % EX AERO
1.0000 "application " | INHALATION_SPRAY | CUTANEOUS | Status: DC | PRN
  Administered 2013-12-15: 1 via TOPICAL
  Filled 2013-12-15: qty 56

## 2013-12-15 NOTE — Lactation Note (Signed)
This note was copied from the chart of [name]. Lactation Consultation Note   Initial consult with this mom of a NICU baby, somewhere between 34-[redacted] weeks gestation.I reviewed briefly how to use the premie setting. Mom knows hand expression.I will do more teaching from the NICU booklet tomorrow.   Patient Name: Debbie Ammie FerrierCassie Eisenhour ZOXWR'UToday's Date: 12/15/2013 Reason for consult: Initial assessment;NICU baby;Late preterm infant   Maternal Data Formula Feeding for Exclusion: Yes (baby inNICU) Infant to breast within first hour of birth: No Breastfeeding delayed due to:: Infant status Has patient been taught Hand Expression?: No (mom knows hand expression) Does the patient have breastfeeding experience prior to this delivery?: Yes  Feeding Feeding Type: Formula  LATCH Score/Interventions                      Lactation Tools Discussed/Used Tools: Pump Breast pump type: Double-Electric Breast Pump WIC Program: No Pump Review: Setup, frequency, and cleaning;Other (comment) (premie setting) Initiated by:: bedside RN within 4 hours of delivery Date initiated:: 12/15/13   Consult Status Consult Status: Follow-up Date: 12/16/13 Follow-up type: In-patient    Alfred LevinsLee, Tanekia Ryans Anne 12/15/2013, 5:36 PM

## 2013-12-15 NOTE — Progress Notes (Signed)
Patient ID: [name], female   DOB: 02/01/1981, 33 y.o.   MRN: [id number] REactive NST Rare contractions [facility] as planned in am.

## 2013-12-15 NOTE — Anesthesia Preprocedure Evaluation (Signed)

## 2013-12-15 NOTE — Anesthesia Procedure Notes (Signed)

## 2013-12-15 NOTE — Progress Notes (Signed)
[name] is a 33 y.o. G4P2013 at 6341w0d by LMP admitted for PROM for IOL  Subjective: Comfortable with epidural  Objective: BP 118/63  Pulse 90  Temp(Src) 98 F (36.7 C) (Oral)  Resp 16  Ht 5\' 6"  (1.676 m)  Wt 74.571 kg (164 lb 6.4 oz)  BMI 26.55 kg/m2  SpO2 100%      FHT:  FHR: 155 bpm, variability: moderate,  accelerations:  Present,  decelerations:  Absent UC:   regular, every 3 minutes SVE:   Dilation: 2 Effacement (%): 60 Station: -2 Exam by:: Google[employer] RN  Labs: Lab Results  Component Value Date   WBC 13.4* 12/14/2013   HGB 12.8 12/14/2013   HCT 37.3 12/14/2013   MCV 92.1 12/14/2013   PLT 162 12/14/2013    Assessment / Plan: Induction of labor due to PPROM at 34 weeks,  progressing well on pitocin  Labor: Progressing normally Preeclampsia:  no signs or symptoms of toxicity Fetal Wellbeing:  Category I Pain Control:  Epidural I/D:  n/a Anticipated MOD:  NSVD  Claudell Rhody J 12/15/2013, 10:13 AM

## 2013-12-16 LAB — CBC
HCT: 33.3 % — ABNORMAL LOW (ref 36.0–46.0)
HEMOGLOBIN: 11.3 g/dL — AB (ref 12.0–15.0)
MCH: 31.8 pg (ref 26.0–34.0)
MCHC: 33.9 g/dL (ref 30.0–36.0)
MCV: 93.8 fL (ref 78.0–100.0)
PLATELETS: 146 10*3/uL — AB (ref 150–400)
RBC: 3.55 MIL/uL — AB (ref 3.87–5.11)
RDW: 13.4 % (ref 11.5–15.5)
WBC: 10.9 10*3/uL — ABNORMAL HIGH (ref 4.0–10.5)

## 2013-12-16 MED ORDER — RHO D IMMUNE GLOBULIN 1500 UNIT/2ML IJ SOSY
300.0000 ug | PREFILLED_SYRINGE | Freq: Once | INTRAMUSCULAR | Status: AC
  Administered 2013-12-16: 300 ug via INTRAMUSCULAR
  Filled 2013-12-16: qty 2

## 2013-12-16 NOTE — Anesthesia Postprocedure Evaluation (Signed)
  Anesthesia Post-op Note  Patient: [name]  Procedure(s) Performed: * No procedures listed *  Patient Location: Mother/Baby  Anesthesia Type:Epidural  Level of Consciousness: awake  Airway and Oxygen Therapy: Patient Spontanous Breathing  Post-op Pain: none  Post-op Assessment: Patient's Cardiovascular Status Stable, Respiratory Function Stable, Patent Airway, No signs of Nausea or vomiting, Adequate PO intake, Pain level controlled, No headache, No backache, No residual numbness and No residual motor weakness  Post-op Vital Signs: Reviewed and stable  Last Vitals:  Filed Vitals:   12/16/13 0610  BP: 114/76  Pulse: 65  Temp: 36.3 C  Resp: 20    Complications: No apparent anesthesia complications

## 2013-12-16 NOTE — Progress Notes (Signed)
Patient ID: [name], female   DOB: 06/17/1980, 33 y.o.   MRN: [id number] PPD # 1 SVD  S:  Reports feeling a little sore, but well             Tolerating po/ No nausea or vomiting             Bleeding is light             Cramping increased, but well-controlled with ibuprofen (OTC)             Up ad lib / ambulatory / voiding without difficulties    Newborn  Information for the patient's newborn:  Wray KearnsSawyer, Boy [name] [[id number]][[id number]]  female  breast feeding  / Circumcision baby in NICU   O:  A & O x 3, in no apparent distress              VS:  Filed Vitals:   12/15/13 1415 12/15/13 1741 12/15/13 2300 12/16/13 0610  BP: 111/65 121/68 114/67 114/76  Pulse: 67 85 66 65  Temp: 97.8 F (36.6 C) 97.5 F (36.4 C) 98 F (36.7 C) 97.4 F (36.3 C)  TempSrc: Oral Oral Oral Oral  Resp: 18 20 18 20   Height:      Weight:      SpO2:        LABS:  Recent Labs  12/14/13 2300 12/16/13 0800  WBC 13.4* 10.9*  HGB 12.8 11.3*  HCT 37.3 33.3*  PLT 162 146*    Blood type: --/--/A NEG (07/19 2300)  Rubella: Immune (02/04 0000)   I&O: I/O last 3 completed shifts: In: -  Out: 200 [Blood:200]             Lungs: Clear and unlabored  Heart: regular rate and rhythm / no murmurs  Abdomen: soft, non-tender, non-distended              Fundus: firm, non-tender, U-2  Perineum: intact, no edema  Lochia: scant  Extremities: no edema, no calf pain or tenderness, no Homans    A/P: PPD # 1  32 y.o., [id number], S/P preterm vaginal delivery   Doing well - stable status  Routine post partum orders  Anticipate discharge tomorrow    Raelyn MoraAWSON, Adamaris King, M, MSN, CNM 12/16/2013, 8:37 AM

## 2013-12-16 NOTE — Lactation Note (Signed)
This note was copied from the chart of [name]. Lactation Consultation Note    Follow up consult with this mom of a NICU baby, now 3325 hours old, and 34 1/7 weeks CGA by dates(baby appears more mature). Mom was holding baby in NICU, while I spoke to her. She is pumping and getting less colostrum than yesterday. I told her this is normal, but her milk should transition in between 48-72 hours, and then she will see an increase in supply. Mom denies any questions at this time. She already has a DEP at home.   Patient Name: [name] [id number]'s Date: 12/16/2013 Reason for consult: Follow-up assessment;NICU baby   Maternal Data    Feeding    LATCH Score/Interventions                      Lactation Tools Discussed/Used     Consult Status Consult Status: Follow-up Date: 12/17/13 Follow-up type: In-patient    Alfred LevinsLee, Rojelio Uhrich Anne 12/16/2013, 1:01 PM

## 2013-12-17 LAB — RH IG WORKUP (INCLUDES ABO/RH)
ABO/RH(D): A NEG
Fetal Screen: NEGATIVE
GESTATIONAL AGE(WKS): 34
Unit division: 0

## 2013-12-17 MED ORDER — IBUPROFEN 600 MG PO TABS: 600.0000 mg | ORAL_TABLET | Freq: Four times a day (QID) | ORAL | Status: DC

## 2013-12-17 MED ORDER — OXYCODONE-ACETAMINOPHEN 5-325 MG PO TABS: 1.0000 | ORAL_TABLET | ORAL | Status: DC | PRN

## 2013-12-17 NOTE — Progress Notes (Signed)
PPD 2 SVD  S:  Reports feeling well              Tolerating po/ No nausea or vomiting             Bleeding is spotting, light             Pain controlled with motrin and percocet             Up ad lib / ambulatory / voiding QS  Newborn in NICU - pending culture results / breast feeding planned - pumping for feedings                                             circumcision uncertain - possible hypospadios  O:               VS: BP 109/74  Pulse 80  Temp(Src) 98.6 F (37 C) (Oral)  Resp 18  Ht 5\' 6"  (1.676 m)  Wt 74.571 kg (164 lb 6.4 oz)  BMI 26.55 kg/m2  SpO2 100%  Breastfeeding? Unknown   LABS:  Positive antibody screen Anti-D ( received Rhogam 6/11) - expected test result             Recent Labs  12/14/13 2300 12/16/13 0800  WBC 13.4* 10.9*  HGB 12.8 11.3*  PLT 162 146*               Blood type: --/--/A NEG (07/21 0800)  Rubella: Immune (02/04 0000)                                Physical Exam:             Alert and oriented X3  Abdomen: soft, non-tender, non-distended              Fundus: firm, non-tender, U-1  Perineum: no edema  Lochia: light  Extremities: trace edema, no calf pain or tenderness    A: PPD # 2   Doing well - stable status  P: Routine post partum orders  DC home today - if possible newborn DC tomorrow - recommend rooming-in tonight  Marlinda MikeBAILEY, Khaidyn Staebell CNM, MSN, [facility] 12/17/2013, 9:03 AM

## 2013-12-17 NOTE — Discharge Summary (Signed)
Obstetric Discharge Summary  Reason for Admission: onset of labor and rupture of membranes - PPROM @ 34 weeks Prenatal Procedures: ultrasound Intrapartum Procedures: spontaneous vaginal delivery after pitocin augmentation Postpartum Procedures: Rho(D) Ig Complications-Operative and Postpartum: none Hemoglobin  Date Value Ref Range Status  12/16/2013 11.3* 12.0 - 15.0 g/dL Final     HCT  Date Value Ref Range Status  12/16/2013 33.3* 36.0 - 46.0 % Final    Physical Exam:  General: alert, cooperative and no distress Lochia: appropriate Uterine Fundus: firm Incision: healing well DVT Evaluation: No evidence of DVT seen on physical exam.  Discharge Diagnoses: Preterm birth at 34 weeks after PPROM  Discharge Information: Date: 12/17/2013 Activity: pelvic rest Diet: routine Medications: PNV, Ibuprofen and Percocet Condition: stable Instructions: refer to practice specific booklet Discharge to: home Follow-up Information   Follow up with Debbie AdenAAVON,RICHARD J, MD. Schedule an appointment as soon as possible for a visit in 6 weeks.   Specialty:  Obstetrics and Gynecology   Contact information:   [address] [place] [address] [phone]       Newborn Data: Live born female  Birth Weight: 6 lb 4.5 oz (2850 g) APGAR: 8, 9  Home with mother.  Debbie MikeBAILEY, [name] 12/17/2013, 9:34 AM

## 2013-12-17 NOTE — Lactation Note (Signed)
This note was copied from the chart of [name]. [facility] mom wants to attempt to latch baby.  Baby latched and sucked well for one minute then stopped sucking. Nipple shield placed; baby latched and sucked for one minute then stopped and was asleep. No colostrum present in the nipple shield. A pre- and post- weight was done for this feeding, baby took 0 ml breast milk while breastfeeding.  The entire attempt lasted 15 minutes. Mom then fed expressed breast milk via bottle.  Encouraged mom that this was a successful practice feeding, and to keep trying breastfeeding. Enc mom to continue pumping at least 8 times a day and once at night.  Patient Name: Debbie Ammie FerrierCassie Szczesniak ZOXWR'UToday's Date: 12/17/2013 Reason for consult: Follow-up assessment;NICU baby   Maternal Data    Feeding Feeding Type: Breast Fed Nipple Type: Slow - flow Length of feed: 2 min  LATCH Score/Interventions Latch: Grasps breast easily, tongue down, lips flanged, rhythmical sucking. (One minute)  Audible Swallowing: None  Type of Nipple: Everted at rest and after stimulation  Comfort (Breast/Nipple): Soft / non-tender     Hold (Positioning): Assistance needed to correctly position infant at breast and maintain latch. Intervention(s): Support Pillows  LATCH Score: 7  Lactation Tools Discussed/Used Tools: Nipple Shields Nipple shield size: 16   Consult Status Consult Status: Follow-up Follow-up type: In-patient    Octavio MannsSanders, Zettie Gootee [facility] 12/17/2013, 2:28 PM

## 2013-12-18 LAB — TYPE AND SCREEN
ABO/RH(D): A NEG
Antibody Screen: POSITIVE
DAT, IGG: NEGATIVE
UNIT DIVISION: 0
UNIT DIVISION: 0

## 2013-12-22 ENCOUNTER — Ambulatory Visit: Payer: Self-pay

## 2013-12-22 NOTE — Lactation Note (Signed)
This note was copied from the chart of [name]. Lactation Consultation Note     Follow up consult with this mom and baby in NICU  , now 157 days old and 35 weeks CGA. I assisted mom with latching baby in cross cradle hold, with 20 nipple shield. He latched with soft, intermittent suckles, and with a pre and post weight, he transferred 4 mls at the breast. He was then help by mom, and ng fed .  Patient Name: [name]: 12/22/2013 Reason for consult: Follow-up assessment;NICU baby   Maternal Data    Feeding Feeding Type: Breast Fed (Simultaneous filing. User may not have seen previous data.) Length of feed: 20 min (Simultaneous filing. User may not have seen previous data.)  LATCH Score/Interventions Latch: Repeated attempts needed to sustain latch, nipple held in mouth throughout feeding, stimulation needed to elicit sucking reflex. (baby latched and sukcled softly with 20 nipple shiled. Mom has lots of milk) Intervention(s): Adjust position;Assist with latch;Breast compression  Audible Swallowing: None Intervention(s): Skin to skin;Hand expression  Type of Nipple: Everted at rest and after stimulation  Comfort (Breast/Nipple): Soft / non-tender     Hold (Positioning): Assistance needed to correctly position infant at breast and maintain latch. Intervention(s): Breastfeeding basics reviewed;Support Pillows;Position options;Skin to skin  LATCH Score: 6  Lactation Tools Discussed/Used Nipple shield size: 20   Consult Status Consult Status: PRN Follow-up type: In-patient (NICU)    [name], [name] Anne 12/22/2013, 2:49 PM

## 2014-03-30 ENCOUNTER — Encounter (HOSPITAL_COMMUNITY): Payer: Self-pay | Admitting: *Deleted

## 2014-08-14 ENCOUNTER — Ambulatory Visit (INDEPENDENT_AMBULATORY_CARE_PROVIDER_SITE_OTHER): Payer: BLUE CROSS/BLUE SHIELD | Admitting: Medical

## 2014-08-14 ENCOUNTER — Encounter: Payer: Self-pay | Admitting: Medical

## 2014-08-14 ENCOUNTER — Ambulatory Visit: Payer: Self-pay | Admitting: Medical

## 2014-08-14 VITALS — BP 124/66 | HR 64 | Temp 97.7°F | Ht 67.0 in | Wt 137.0 lb

## 2014-08-14 DIAGNOSIS — Z0189 Encounter for other specified special examinations: Secondary | ICD-10-CM

## 2014-08-14 DIAGNOSIS — Z0000 Encounter for general adult medical examination without abnormal findings: Secondary | ICD-10-CM | POA: Insufficient documentation

## 2014-08-14 NOTE — Patient Instructions (Signed)
Wellness examination Will put in future labs today fasting cbc, cmp, tsh, and lipid panel. Tetanus likely up to date. Pt work in surgery. If not up to date then can get when in for labs. Pt declines flu vaccine. She is breast feeding. Pt had HIV screening.    Follow up date to be determined after lab review.   Preventive Care for Adults   A healthy lifestyle and preventive care can promote health and wellness. Preventive health guidelines for women include the following key practices.  A routine yearly physical is a good way to check with your health care provider about your health and preventive screening. It is a chance to share any concerns and updates on your health and to receive a thorough exam.  Visit your dentist for a routine exam and preventive care every 6 months. Brush your teeth twice a day and floss once a day. Good oral hygiene prevents tooth decay and gum disease.  The frequency of eye exams is based on your age, health, family medical history, use of contact lenses, and other factors. Follow your health care provider's recommendations for frequency of eye exams.  Eat a healthy diet. Foods like vegetables, fruits, whole grains, low-fat dairy products, and lean protein foods contain the nutrients you need without too many calories. Decrease your intake of foods high in solid fats, added sugars, and salt. Eat the right amount of calories for you.Get information about a proper diet from your health care provider, if necessary.  Regular physical exercise is one of the most important things you can do for your health. Most adults should get at least 150 minutes of moderate-intensity exercise (any activity that increases your heart rate and causes you to sweat) each week. In addition, most adults need muscle-strengthening exercises on 2 or more days a week.  Maintain a healthy weight. The body mass index (BMI) is a screening tool to identify possible weight problems. It provides an  estimate of body fat based on height and weight. Your health care provider can find your BMI and can help you achieve or maintain a healthy weight.For adults 20 years and older:  A BMI below 18.5 is considered underweight.  A BMI of 18.5 to 24.9 is normal.  A BMI of 25 to 29.9 is considered overweight.  A BMI of 30 and above is considered obese.  Maintain normal blood lipids and cholesterol levels by exercising and minimizing your intake of saturated fat. Eat a balanced diet with plenty of fruit and vegetables. Blood tests for lipids and cholesterol should begin at age 77 and be repeated every 5 years. If your lipid or cholesterol levels are high, you are over 50, or you are at high risk for heart disease, you may need your cholesterol levels checked more frequently.Ongoing high lipid and cholesterol levels should be treated with medicines if diet and exercise are not working.  If you smoke, find out from your health care provider how to quit. If you do not use tobacco, do not start.  Lung cancer screening is recommended for adults aged 45-80 years who are at high risk for developing lung cancer because of a history of smoking. A yearly low-dose CT scan of the lungs is recommended for people who have at least a 30-pack-year history of smoking and are a current smoker or have quit within the past 15 years. A pack year of smoking is smoking an average of 1 pack of cigarettes a day for 1 year (for example:  1 pack a day for 30 years or 2 packs a day for 15 years). Yearly screening should continue until the smoker has stopped smoking for at least 15 years. Yearly screening should be stopped for people who develop a health problem that would prevent them from having lung cancer treatment.  If you are pregnant, do not drink alcohol. If you are breastfeeding, be very cautious about drinking alcohol. If you are not pregnant and choose to drink alcohol, do not have more than 1 drink per day. One drink is  considered to be 12 ounces (355 mL) of beer, 5 ounces (148 mL) of wine, or 1.5 ounces (44 mL) of liquor.  Avoid use of street drugs. Do not share needles with anyone. Ask for help if you need support or instructions about stopping the use of drugs.  High blood pressure causes heart disease and increases the risk of stroke. Your blood pressure should be checked at least every 1 to 2 years. Ongoing high blood pressure should be treated with medicines if weight loss and exercise do not work.  If you are 59-46 years old, ask your health care provider if you should take aspirin to prevent strokes.  Diabetes screening involves taking a blood sample to check your fasting blood sugar level. This should be done once every 3 years, after age 9, if you are within normal weight and without risk factors for diabetes. Testing should be considered at a younger age or be carried out more frequently if you are overweight and have at least 1 risk factor for diabetes.  Breast cancer screening is essential preventive care for women. You should practice "breast self-awareness." This means understanding the normal appearance and feel of your breasts and may include breast self-examination. Any changes detected, no matter how small, should be reported to a health care provider. Women in their 41s and 30s should have a clinical breast exam (CBE) by a health care provider as part of a regular health exam every 1 to 3 years. After age 16, women should have a CBE every year. Starting at age 60, women should consider having a mammogram (breast X-ray test) every year. Women who have a family history of breast cancer should talk to their health care provider about genetic screening. Women at a high risk of breast cancer should talk to their health care providers about having an MRI and a mammogram every year.  Breast cancer gene (BRCA)-related cancer risk assessment is recommended for women who have family members with BRCA-related  cancers. BRCA-related cancers include breast, ovarian, tubal, and peritoneal cancers. Having family members with these cancers may be associated with an increased risk for harmful changes (mutations) in the breast cancer genes BRCA1 and BRCA2. Results of the assessment will determine the need for genetic counseling and BRCA1 and BRCA2 testing.  Routine pelvic exams to screen for cancer are no longer recommended for nonpregnant women who are considered low risk for cancer of the pelvic organs (ovaries, uterus, and vagina) and who do not have symptoms. Ask your health care provider if a screening pelvic exam is right for you.  If you have had past treatment for cervical cancer or a condition that could lead to cancer, you need Pap tests and screening for cancer for at least 20 years after your treatment. If Pap tests have been discontinued, your risk factors (such as having a new sexual partner) need to be reassessed to determine if screening should be resumed. Some women have medical problems  that increase the chance of getting cervical cancer. In these cases, your health care provider may recommend more frequent screening and Pap tests.  The HPV test is an additional test that may be used for cervical cancer screening. The HPV test looks for the virus that can cause the cell changes on the cervix. The cells collected during the Pap test can be tested for HPV. The HPV test could be used to screen women aged 76 years and older, and should be used in women of any age who have unclear Pap test results. After the age of 62, women should have HPV testing at the same frequency as a Pap test.  Colorectal cancer can be detected and often prevented. Most routine colorectal cancer screening begins at the age of 74 years and continues through age 3 years. However, your health care provider may recommend screening at an earlier age if you have risk factors for colon cancer. On a yearly basis, your health care provider  may provide home test kits to check for hidden blood in the stool. Use of a small camera at the end of a tube, to directly examine the colon (sigmoidoscopy or colonoscopy), can detect the earliest forms of colorectal cancer. Talk to your health care provider about this at age 45, when routine screening begins. Direct exam of the colon should be repeated every 5-10 years through age 29 years, unless early forms of pre-cancerous polyps or small growths are found.  People who are at an increased risk for hepatitis B should be screened for this virus. You are considered at high risk for hepatitis B if:  You were born in a country where hepatitis B occurs often. Talk with your health care provider about which countries are considered high risk.  Your parents were born in a high-risk country and you have not received a shot to protect against hepatitis B (hepatitis B vaccine).  You have HIV or AIDS.  You use needles to inject street drugs.  You live with, or have sex with, someone who has hepatitis B.  You get hemodialysis treatment.  You take certain medicines for conditions like cancer, organ transplantation, and autoimmune conditions.  Hepatitis C blood testing is recommended for all people born from [birth year] through 1965 and any individual with known risks for hepatitis C.  Practice safe sex. Use condoms and avoid high-risk sexual practices to reduce the spread of sexually transmitted infections (STIs). STIs include gonorrhea, chlamydia, syphilis, trichomonas, herpes, HPV, and human immunodeficiency virus (HIV). Herpes, HIV, and HPV are viral illnesses that have no cure. They can result in disability, cancer, and death.  You should be screened for sexually transmitted illnesses (STIs) including gonorrhea and chlamydia if:  You are sexually active and are younger than 24 years.  You are older than 24 years and your health care provider tells you that you are at risk for this type of  infection.  Your sexual activity has changed since you were last screened and you are at an increased risk for chlamydia or gonorrhea. Ask your health care provider if you are at risk.  If you are at risk of being infected with HIV, it is recommended that you take a prescription medicine daily to prevent HIV infection. This is called preexposure prophylaxis (PrEP). You are considered at risk if:  You are a heterosexual woman, are sexually active, and are at increased risk for HIV infection.  You take drugs by injection.  You are sexually active with a partner  who has HIV.  Talk with your health care provider about whether you are at high risk of being infected with HIV. If you choose to begin PrEP, you should first be tested for HIV. You should then be tested every 3 months for as long as you are taking PrEP.  Osteoporosis is a disease in which the bones lose minerals and strength with aging. This can result in serious bone fractures or breaks. The risk of osteoporosis can be identified using a bone density scan. Women ages 64 years and over and women at risk for fractures or osteoporosis should discuss screening with their health care providers. Ask your health care provider whether you should take a calcium supplement or vitamin D to reduce the rate of osteoporosis.  Menopause can be associated with physical symptoms and risks. Hormone replacement therapy is available to decrease symptoms and risks. You should talk to your health care provider about whether hormone replacement therapy is right for you.  Use sunscreen. Apply sunscreen liberally and repeatedly throughout the day. You should seek shade when your shadow is shorter than you. Protect yourself by wearing long sleeves, pants, a wide-brimmed hat, and sunglasses year round, whenever you are outdoors.  Once a month, do a whole body skin exam, using a mirror to look at the skin on your back. Tell your health care provider of new moles,  moles that have irregular borders, moles that are larger than a pencil eraser, or moles that have changed in shape or color.  Stay current with required vaccines (immunizations).  Influenza vaccine. All adults should be immunized every year.  Tetanus, diphtheria, and acellular pertussis (Td, Tdap) vaccine. Pregnant women should receive 1 dose of Tdap vaccine during each pregnancy. The dose should be obtained regardless of the length of time since the last dose. Immunization is preferred during the 27th-36th week of gestation. An adult who has not previously received Tdap or who does not know her vaccine status should receive 1 dose of Tdap. This initial dose should be followed by tetanus and diphtheria toxoids (Td) booster doses every 10 years. Adults with an unknown or incomplete history of completing a 3-dose immunization series with Td-containing vaccines should begin or complete a primary immunization series including a Tdap dose. Adults should receive a Td booster every 10 years.  Varicella vaccine. An adult without evidence of immunity to varicella should receive 2 doses or a second dose if she has previously received 1 dose. Pregnant females who do not have evidence of immunity should receive the first dose after pregnancy. This first dose should be obtained before leaving the health care facility. The second dose should be obtained 4-8 weeks after the first dose.  Human papillomavirus (HPV) vaccine. Females aged 13-26 years who have not received the vaccine previously should obtain the 3-dose series. The vaccine is not recommended for use in pregnant females. However, pregnancy testing is not needed before receiving a dose. If a female is found to be pregnant after receiving a dose, no treatment is needed. In that case, the remaining doses should be delayed until after the pregnancy. Immunization is recommended for any person with an immunocompromised condition through the age of 3 years if she  did not get any or all doses earlier. During the 3-dose series, the second dose should be obtained 4-8 weeks after the first dose. The third dose should be obtained 24 weeks after the first dose and 16 weeks after the second dose.  Zoster vaccine. One dose  is recommended for adults aged 34 years or older unless certain conditions are present.  Measles, mumps, and rubella (MMR) vaccine. Adults born before [birth year] generally are considered immune to measles and mumps. Adults born in [birth year] or later should have 1 or more doses of MMR vaccine unless there is a contraindication to the vaccine or there is laboratory evidence of immunity to each of the three diseases. A routine second dose of MMR vaccine should be obtained at least 28 days after the first dose for students attending postsecondary schools, health care workers, or international travelers. People who received inactivated measles vaccine or an unknown type of measles vaccine during 1963-1967 should receive 2 doses of MMR vaccine. People who received inactivated mumps vaccine or an unknown type of mumps vaccine before 1979 and are at high risk for mumps infection should consider immunization with 2 doses of MMR vaccine. For females of childbearing age, rubella immunity should be determined. If there is no evidence of immunity, females who are not pregnant should be vaccinated. If there is no evidence of immunity, females who are pregnant should delay immunization until after pregnancy. Unvaccinated health care workers born before [birth year] who lack laboratory evidence of measles, mumps, or rubella immunity or laboratory confirmation of disease should consider measles and mumps immunization with 2 doses of MMR vaccine or rubella immunization with 1 dose of MMR vaccine.  Pneumococcal 13-valent conjugate (PCV13) vaccine. When indicated, a person who is uncertain of her immunization history and has no record of immunization should receive the PCV13 vaccine. An adult  aged 26 years or older who has certain medical conditions and has not been previously immunized should receive 1 dose of PCV13 vaccine. This PCV13 should be followed with a dose of pneumococcal polysaccharide (PPSV23) vaccine. The PPSV23 vaccine dose should be obtained at least 8 weeks after the dose of PCV13 vaccine. An adult aged 47 years or older who has certain medical conditions and previously received 1 or more doses of PPSV23 vaccine should receive 1 dose of PCV13. The PCV13 vaccine dose should be obtained 1 or more years after the last PPSV23 vaccine dose.  Pneumococcal polysaccharide (PPSV23) vaccine. When PCV13 is also indicated, PCV13 should be obtained first. All adults aged 26 years and older should be immunized. An adult younger than age 68 years who has certain medical conditions should be immunized. Any person who resides in a nursing home or long-term care facility should be immunized. An adult smoker should be immunized. People with an immunocompromised condition and certain other conditions should receive both PCV13 and PPSV23 vaccines. People with human immunodeficiency virus (HIV) infection should be immunized as soon as possible after diagnosis. Immunization during chemotherapy or radiation therapy should be avoided. Routine use of PPSV23 vaccine is not recommended for American Indians, [place]atives, or people younger than 65 years unless there are medical conditions that require PPSV23 vaccine. When indicated, people who have unknown immunization and have no record of immunization should receive PPSV23 vaccine. One-time revaccination 5 years after the first dose of PPSV23 is recommended for people aged 19-64 years who have chronic kidney failure, nephrotic syndrome, asplenia, or immunocompromised conditions. People who received 1-2 doses of PPSV23 before age 1 years should receive another dose of PPSV23 vaccine at age 2 years or later if at least 5 years have passed since the previous  dose. Doses of PPSV23 are not needed for people immunized with PPSV23 at or after age 58 years.  Meningococcal vaccine. Adults with asplenia  or persistent complement component deficiencies should receive 2 doses of quadrivalent meningococcal conjugate (MenACWY-D) vaccine. The doses should be obtained at least 2 months apart. Microbiologists working with certain meningococcal bacteria, [employer] recruits, people at risk during an outbreak, and people who travel to or live in countries with a high rate of meningitis should be immunized. A first-year college student up through age 56 years who is living in a residence hall should receive a dose if she did not receive a dose on or after her 16th birthday. Adults who have certain high-risk conditions should receive one or more doses of vaccine.  Hepatitis A vaccine. Adults who wish to be protected from this disease, have certain high-risk conditions, work with hepatitis A-infected animals, work in hepatitis A research labs, or travel to or work in countries with a high rate of hepatitis A should be immunized. Adults who were previously unvaccinated and who anticipate close contact with an international adoptee during the first 60 days after arrival in the [place] from a country with a high rate of hepatitis A should be immunized.  Hepatitis B vaccine. Adults who wish to be protected from this disease, have certain high-risk conditions, may be exposed to blood or other infectious body fluids, are household contacts or sex partners of hepatitis B positive people, are clients or workers in certain care facilities, or travel to or work in countries with a high rate of hepatitis B should be immunized.  Haemophilus influenzae type b (Hib) vaccine. A previously unvaccinated person with asplenia or sickle cell disease or having a scheduled splenectomy should receive 1 dose of Hib vaccine. Regardless of previous immunization, a recipient of a hematopoietic stem cell  transplant should receive a 3-dose series 6-12 months after her successful transplant. Hib vaccine is not recommended for adults with HIV infection. Preventive Services / Frequency Ages 43 to 65 years  Blood pressure check.** / Every 1 to 2 years.  Lipid and cholesterol check.** / Every 5 years beginning at age 41.  Clinical breast exam.** / Every 3 years for women in their 83s and 76s.  BRCA-related cancer risk assessment.** / For women who have family members with a BRCA-related cancer (breast, ovarian, tubal, or peritoneal cancers).  Pap test.** / Every 2 years from ages 65 through 109. Every 3 years starting at age 67 through age 30 or 53 with a history of 3 consecutive normal Pap tests.  HPV screening.** / Every 3 years from ages 57 through ages 60 to 18 with a history of 3 consecutive normal Pap tests.  Hepatitis C blood test.** / For any individual with known risks for hepatitis C.  Skin self-exam. / Monthly.  Influenza vaccine. / Every year.  Tetanus, diphtheria, and acellular pertussis (Tdap, Td) vaccine.** / Consult your health care provider. Pregnant women should receive 1 dose of Tdap vaccine during each pregnancy. 1 dose of Td every 10 years.  Varicella vaccine.** / Consult your health care provider. Pregnant females who do not have evidence of immunity should receive the first dose after pregnancy.  HPV vaccine. / 3 doses over 6 months, if 17 and younger. The vaccine is not recommended for use in pregnant females. However, pregnancy testing is not needed before receiving a dose.  Measles, mumps, rubella (MMR) vaccine.** / You need at least 1 dose of MMR if you were born in 1957 or later. You may also need a 2nd dose. For females of childbearing age, rubella immunity should be determined. If there is  no evidence of immunity, females who are not pregnant should be vaccinated. If there is no evidence of immunity, females who are pregnant should delay immunization until after  pregnancy.  Pneumococcal 13-valent conjugate (PCV13) vaccine.** / Consult your health care provider.  Pneumococcal polysaccharide (PPSV23) vaccine.** / 1 to 2 doses if you smoke cigarettes or if you have certain conditions.  Meningococcal vaccine.** / 1 dose if you are age 84 to 66 years and a Market researcher living in a residence hall, or have one of several medical conditions, you need to get vaccinated against meningococcal disease. You may also need additional booster doses.  Hepatitis A vaccine.** / Consult your health care provider.  Hepatitis B vaccine.** / Consult your health care provider.  Haemophilus influenzae type b (Hib) vaccine.** / Consult your health care provider. Ages 36 to 104 years  Blood pressure check.** / Every 1 to 2 years.  Lipid and cholesterol check.** / Every 5 years beginning at age 91 years.  Lung cancer screening. / Every year if you are aged 73-80 years and have a 30-pack-year history of smoking and currently smoke or have quit within the past 15 years. Yearly screening is stopped once you have quit smoking for at least 15 years or develop a health problem that would prevent you from having lung cancer treatment.  Clinical breast exam.** / Every year after age 46 years.  BRCA-related cancer risk assessment.** / For women who have family members with a BRCA-related cancer (breast, ovarian, tubal, or peritoneal cancers).  Mammogram.** / Every year beginning at age 104 years and continuing for as long as you are in good health. Consult with your health care provider.  Pap test.** / Every 3 years starting at age 16 years through age 18 or 28 years with a history of 3 consecutive normal Pap tests.  HPV screening.** / Every 3 years from ages 63 years through ages 14 to 8 years with a history of 3 consecutive normal Pap tests.  Fecal occult blood test (FOBT) of stool. / Every year beginning at age 14 years and continuing until age 27 years. You may  not need to do this test if you get a colonoscopy every 10 years.  Flexible sigmoidoscopy or colonoscopy.** / Every 5 years for a flexible sigmoidoscopy or every 10 years for a colonoscopy beginning at age 2 years and continuing until age 2 years.  Hepatitis C blood test.** / For all people born from [birth year] through 1964 and any individual with known risks for hepatitis C.  Skin self-exam. / Monthly.  Influenza vaccine. / Every year.  Tetanus, diphtheria, and acellular pertussis (Tdap/Td) vaccine.** / Consult your health care provider. Pregnant women should receive 1 dose of Tdap vaccine during each pregnancy. 1 dose of Td every 10 years.  Varicella vaccine.** / Consult your health care provider. Pregnant females who do not have evidence of immunity should receive the first dose after pregnancy.  Zoster vaccine.** / 1 dose for adults aged 7 years or older.  Measles, mumps, rubella (MMR) vaccine.** / You need at least 1 dose of MMR if you were born in 1957 or later. You may also need a 2nd dose. For females of childbearing age, rubella immunity should be determined. If there is no evidence of immunity, females who are not pregnant should be vaccinated. If there is no evidence of immunity, females who are pregnant should delay immunization until after pregnancy.  Pneumococcal 13-valent conjugate (PCV13) vaccine.** / Consult your health care  provider.  Pneumococcal polysaccharide (PPSV23) vaccine.** / 1 to 2 doses if you smoke cigarettes or if you have certain conditions.  Meningococcal vaccine.** / Consult your health care provider.  Hepatitis A vaccine.** / Consult your health care provider.  Hepatitis B vaccine.** / Consult your health care provider.  Haemophilus influenzae type b (Hib) vaccine.** / Consult your health care provider. Ages 48 years and over  Blood pressure check.** / Every 1 to 2 years.  Lipid and cholesterol check.** / Every 5 years beginning at age 60 years.  Lung  cancer screening. / Every year if you are aged 51-80 years and have a 30-pack-year history of smoking and currently smoke or have quit within the past 15 years. Yearly screening is stopped once you have quit smoking for at least 15 years or develop a health problem that would prevent you from having lung cancer treatment.  Clinical breast exam.** / Every year after age 50 years.  BRCA-related cancer risk assessment.** / For women who have family members with a BRCA-related cancer (breast, ovarian, tubal, or peritoneal cancers).  Mammogram.** / Every year beginning at age 32 years and continuing for as long as you are in good health. Consult with your health care provider.  Pap test.** / Every 3 years starting at age 25 years through age 32 or 42 years with 3 consecutive normal Pap tests. Testing can be stopped between 65 and 70 years with 3 consecutive normal Pap tests and no abnormal Pap or HPV tests in the past 10 years.  HPV screening.** / Every 3 years from ages 72 years through ages 13 or 23 years with a history of 3 consecutive normal Pap tests. Testing can be stopped between 65 and 70 years with 3 consecutive normal Pap tests and no abnormal Pap or HPV tests in the past 10 years.  Fecal occult blood test (FOBT) of stool. / Every year beginning at age 60 years and continuing until age 57 years. You may not need to do this test if you get a colonoscopy every 10 years.  Flexible sigmoidoscopy or colonoscopy.** / Every 5 years for a flexible sigmoidoscopy or every 10 years for a colonoscopy beginning at age 27 years and continuing until age 71 years.  Hepatitis C blood test.** / For all people born from [birth year] through 1964 and any individual with known risks for hepatitis C.  Osteoporosis screening.** / A one-time screening for women ages 29 years and over and women at risk for fractures or osteoporosis.  Skin self-exam. / Monthly.  Influenza vaccine. / Every year.  Tetanus, diphtheria, and  acellular pertussis (Tdap/Td) vaccine.** / 1 dose of Td every 10 years.  Varicella vaccine.** / Consult your health care provider.  Zoster vaccine.** / 1 dose for adults aged 4 years or older.  Pneumococcal 13-valent conjugate (PCV13) vaccine.** / Consult your health care provider.  Pneumococcal polysaccharide (PPSV23) vaccine.** / 1 dose for all adults aged 66 years and older.  Meningococcal vaccine.** / Consult your health care provider.  Hepatitis A vaccine.** / Consult your health care provider.  Hepatitis B vaccine.** / Consult your health care provider.  Haemophilus influenzae type b (Hib) vaccine.** / Consult your health care provider. ** Family history and personal history of risk and conditions may change your health care provider's recommendations. Document Released: 07/11/2001 Document Revised: 09/29/2013 Document Reviewed: 10/10/2010 [facility] Patient Information 2015 [place], [place]. This information is not intended to replace advice given to you by your health care provider. Make sure  you discuss any questions you have with your health care provider.

## 2014-08-14 NOTE — Progress Notes (Signed)
Subjective:    Patient ID: [name], female    DOB: 08/07/1980, 33 y.o.   MRN: [id number]  HPI   I have reviewed pt PMH, PSH, FH, Social History and Surgical History  No major medical problems. Surgical hx reviewed.   Pt has no acute problems. She is for general physical.   Pt had pap smear in Sept 2015. Normal at that time. Pt has no family history of breast cancer. Gyn did breast exam in September.  Pt has 64 month old and she is breast feeding him.  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 (plans to get tubal ligation before July)  Review of Systems  Constitutional: Negative for fever, chills and fatigue.  HENT: Negative for congestion, ear discharge, ear pain, hearing loss, nosebleeds, postnasal drip, sinus pressure and sore throat.   Eyes: Negative for itching.  Respiratory: Negative for cough, choking, chest tightness and wheezing.   Cardiovascular: Negative for chest pain and palpitations.  Gastrointestinal: Negative for nausea, vomiting, abdominal pain, diarrhea, constipation, abdominal distention and anal bleeding.  Musculoskeletal: Negative for myalgias, back pain, neck pain and neck stiffness.  Skin: Negative for rash.  Neurological: Negative for dizziness, numbness and headaches.  Hematological: Negative for adenopathy. Does not bruise/bleed easily.  Psychiatric/Behavioral: Negative for behavioral problems and confusion.   Past Medical History  Diagnosis Date  . Medical history non-contributory     History   Social History  . Marital Status: Married    Spouse Name: N/A  . Number of Children: N/A  . Years of Education: N/A   Occupational History  . Not on file.   Social History Main Topics  . Smoking status: Never Smoker   . Smokeless tobacco: Never Used  . Alcohol Use: Yes     Comment: Not currently since nursing. Social drinker.(light side)  . Drug Use: No  . Sexual  Activity: Not Currently    Birth Control/ Protection: None   Other Topics Concern  . Not on file   Social History Narrative    Past Surgical History  Procedure Laterality Date  . Wisdom tooth extraction    . Cervical cone biopsy  2006  . Tympanostomy tube placement    . Inner ear surgery      No family history on file.  No Known Allergies  No current outpatient prescriptions on file prior to visit.   No current facility-administered medications on file prior to visit.    BP 124/66 mmHg  Pulse 64  Temp(Src) 97.7 F (36.5 C) (Oral)  Ht  (1.702 m)  Wt 137 lb (62.143 kg)  BMI 21.45 kg/m2  SpO2 100%  Breastfeeding? Yes      Objective:   Physical Exam  General   Mental Status- Alert.  Orientation-Oriented x3. Build and Nutrition Well Nourished and Well Developed.  Skin General: Normal.  Color- Normal color. Moisture- Dry.Temperature warm. Lesions: No suspicious lesions  Head, Eyes, Ears, Nose, Thoat Ears-Normal. Auditory Canal-Bilateral-Normal. Tympanic Membrane- Bilateral-Normal. Eyes Fundi- Bilateral-Normal. Pupil- Bilateral- Direct reaction to light normal. Nose & Sinuses- Normal. Nostril- Bilateral-Normal.  Neck Neck- No Bruits or Masses. Thyroid- Normal. No thyromegaly or nodules.  Breast Done in September 2015.   Chest and Lung Exam  Percussion: Quality and Intensity:-Percussion normal. Percussion of chest reveals- No Dullness. Palpation of the chest reveals- Non-tender. Auscultation: Breath sounds-Normal. Adventitious  Sounds:No adventitious   Vaginal Done by gyn in September 2015.  Cardiovascular Inspection: No Heaves. Auscultation: Heart Sounds- Normal sinus rhythm without murmur or gallop, S1 WNL and S2 WNL.  Abdomen Inspection:- Inspection Normal. Inspection of abdomen reveals- No Hernias. Palpation/Percussion: Palpation and Percussion of the abdomen reveal- Non Tender and No Palpable masses. Liver: Other Characteristics- No  Hepatmegaly Spleen:Other Characteristics- No Splenomegaly. Auscultation: Auscultation of the abdomen reveals-Bowel sounds normal and No Abdominal bruits.     Neurologic Mental Status- Normal Cranial Nerves- Normal Bilaterally, Motor- Normal. Strength:5/5 normal muscle strength- All Muscles. General Assessment of Reflexes- Right Knee- 2+. Left Knee- 2+. Coordination- Normal. Gait- Normal. Meningeal Signs- None.  Musculoskeletal Global Assessment General- Joints show full range of motion without obvious deformity and Normal muscle mass. Strength 5/5 in upper and lower extremities.  Lymphatic General lymphatics Description-No Generalized lymphadenopathy.         Assessment & Plan:

## 2014-08-14 NOTE — Progress Notes (Signed)
Pre visit review using our clinic review tool, if applicable. No additional management support is needed unless otherwise documented below in the visit note. 

## 2014-08-14 NOTE — Assessment & Plan Note (Signed)
Will put in future labs today fasting cbc, cmp, tsh, and lipid panel. Tetanus likely up to date. Pt work in surgery. If not up to date then can get when in for labs. Pt declines flu vaccine. She is breast feeding. Pt had HIV screening.

## 2014-08-20 ENCOUNTER — Other Ambulatory Visit: Payer: Self-pay | Admitting: Obstetrics and Gynecology

## 2014-08-25 ENCOUNTER — Other Ambulatory Visit (INDEPENDENT_AMBULATORY_CARE_PROVIDER_SITE_OTHER): Payer: BLUE CROSS/BLUE SHIELD

## 2014-08-25 DIAGNOSIS — Z0000 Encounter for general adult medical examination without abnormal findings: Secondary | ICD-10-CM | POA: Diagnosis not present

## 2014-08-25 DIAGNOSIS — Z0189 Encounter for other specified special examinations: Secondary | ICD-10-CM | POA: Diagnosis not present

## 2014-08-25 LAB — COMPREHENSIVE METABOLIC PANEL
ALBUMIN: 4.4 g/dL (ref 3.5–5.2)
ALK PHOS: 46 U/L (ref 39–117)
ALT: 17 U/L (ref 0–35)
AST: 20 U/L (ref 0–37)
BILIRUBIN TOTAL: 0.9 mg/dL (ref 0.2–1.2)
BUN: 13 mg/dL (ref 6–23)
CALCIUM: 9.1 mg/dL (ref 8.4–10.5)
CO2: 26 meq/L (ref 19–32)
CREATININE: 0.75 mg/dL (ref 0.40–1.20)
Chloride: 106 mEq/L (ref 96–112)
GFR: 94.79 mL/min (ref >60.00)
Glucose, Bld: 84 mg/dL (ref 70–99)
POTASSIUM: 3.8 meq/L (ref 3.5–5.1)
SODIUM: 138 meq/L (ref 135–145)
TOTAL PROTEIN: 6.9 g/dL (ref 6.0–8.3)

## 2014-08-25 LAB — CBC WITH DIFFERENTIAL/PLATELET
BASOS ABS: 0 10*3/uL (ref 0.0–0.1)
Basophils Relative: 0.5 % (ref 0.0–3.0)
Eosinophils Absolute: 0.1 10*3/uL (ref 0.0–0.7)
Eosinophils Relative: 1.4 % (ref 0.0–5.0)
HEMATOCRIT: 42.7 % (ref 36.0–46.0)
Hemoglobin: 14.4 g/dL (ref 12.0–15.0)
LYMPHS ABS: 2.2 10*3/uL (ref 0.7–4.0)
LYMPHS PCT: 32 % (ref 12.0–46.0)
MCHC: 33.8 g/dL (ref 30.0–36.0)
MCV: 89.9 fl (ref 78.0–100.0)
Monocytes Absolute: 0.3 10*3/uL (ref 0.1–1.0)
Monocytes Relative: 5 % (ref 3.0–12.0)
Neutro Abs: 4.2 10*3/uL (ref 1.4–7.7)
Neutrophils Relative %: 61.1 % (ref 43.0–77.0)
PLATELETS: 186 10*3/uL (ref 150.0–400.0)
RBC: 4.75 Mil/uL (ref 3.87–5.11)
RDW: 12.8 % (ref 11.5–15.5)
WBC: 6.8 10*3/uL (ref 4.0–10.5)

## 2014-08-25 LAB — LIPID PANEL
Cholesterol: 143 mg/dL (ref 0–200)
HDL: 63.5 mg/dL (ref >39.00)
LDL Cholesterol: 71 mg/dL (ref 0–99)
NonHDL: 79.5
Total CHOL/HDL Ratio: 2
Triglycerides: 44 mg/dL (ref 0.0–149.0)
VLDL: 8.8 mg/dL (ref 0.0–40.0)

## 2014-08-25 LAB — TSH: TSH: 1.6 u[IU]/mL (ref 0.35–4.50)

## 2014-08-26 ENCOUNTER — Encounter (HOSPITAL_COMMUNITY)
Admission: RE | Admit: 2014-08-26 | Discharge: 2014-08-26 | Disposition: A | Discharge status: Home / Self Care | Payer: BLUE CROSS/BLUE SHIELD | Source: Ambulatory Visit | Attending: Obstetrics and Gynecology | Admitting: Obstetrics and Gynecology

## 2014-08-26 ENCOUNTER — Encounter (HOSPITAL_COMMUNITY): Payer: Self-pay

## 2014-08-26 DIAGNOSIS — Z302 Encounter for sterilization: Secondary | ICD-10-CM | POA: Diagnosis present

## 2014-08-26 LAB — CBC
HCT: 43.7 % (ref 36.0–46.0)
Hemoglobin: 14.9 g/dL (ref 12.0–15.0)
MCH: 30.8 pg (ref 26.0–34.0)
MCHC: 34.1 g/dL (ref 30.0–36.0)
MCV: 90.3 fL (ref 78.0–100.0)
Platelets: 156 10*3/uL (ref 150–400)
RBC: 4.84 MIL/uL (ref 3.87–5.11)
RDW: 12.6 % (ref 11.5–15.5)
WBC: 8.3 10*3/uL (ref 4.0–10.5)

## 2014-08-26 NOTE — Patient Instructions (Signed)
Your procedure is scheduled on:  Friday, August 28, 2014  [facility]:  11:30 a.m.  Pick up the phone at the desk and dial [phone].  Call this number if you have problems the morning of surgery: [phone].  Remember: Do NOT eat food:  AFTER MIDNIGHT Thursday NIGHT Do NOT drink clear liquids after:  AFTER 9:00 a.m. Friday MORNING Take these medicines the morning of surgery with a SIP OF WATER: NONE  Do NOT wear jewelry (body piercing), metal hair clips/bobby pins, make-up, or nail polish. Do NOT wear lotions, powders, or perfumes.  You may wear deoderant. Do NOT shave for 48 hours prior to surgery. Do NOT bring valuables to the hospital. Contacts, dentures, or bridgework may not be worn into surgery. Have a responsible adult drive you home and stay with you for 24 hours after your procedure

## 2014-08-27 MED ORDER — CEFAZOLIN SODIUM-DEXTROSE 2-3 GM-% IV SOLR
2.0000 g | INTRAVENOUS | Status: AC
  Administered 2014-08-28: 2 g via INTRAVENOUS

## 2014-08-27 NOTE — H&P (Signed)
[name]:  [name], [name]               ACCOUNT NO.:  [id number]  MEDICAL RECORD NO.:  [id number]  LOCATION:  [facility]                         [facility]ACILITY:  [facility]  PHYSICIAN:  Lenoard Adenichard J. Cordia Miklos, M.D.DATE OF BIRTH:  1980/06/26  DATE OF ADMISSION:  08/17/2014 DATE OF DISCHARGE:                             HISTORY & PHYSICAL   CHIEF COMPLAINT:  Desires for elective sterilization.  HISTORY OF PRESENT ILLNESS:  A 34 year old white female G4, P4, who presents for elective sterilization.  ALLERGIES:  She has no known drug allergies.  MEDICATIONS:  Birth control pills and multivitamins.  PAST MEDICAL HISTORY:  She has a history of vaginal delivery x3.  SURGICAL HISTORY:  __________ in 2008.  FAMILY HISTORY:  Diabetes, cervical cancer.  PHYSICAL EXAMINATION:  GENERAL:  Well-developed, well-nourished white female, in no acute distress. HEENT:  Normal. NECK:  Supple.  Full range of motion. LUNGS:  Clear. HEART:  Regular rate and rhythm. ABDOMEN:  Soft, nontender. PELVIC:  Reveals normal size uterus and no adnexal masses. EXTREMITIES:  There are no cords. NEUROLOGIC:  Nonfocal. SKIN:  Intact.  IMPRESSION:  Elective sterilization.  PLAN:  Proceed with laparoscopic tubal ligation.  Risks of anesthesia, infection, bleeding, injury to surrounding organs, possible need for repair was discussed, delayed versus immediate complications to include bowel and bladder injury are noted.  The patient acknowledges and wishes to proceed.     Lenoard Adenichard J. Synthia Fairbank, M.D.     RJT/MEDQ  D:  08/27/2014  T:  08/27/2014  Job:  [id number]

## 2014-08-28 ENCOUNTER — Encounter (HOSPITAL_COMMUNITY)
Admission: RE | Disposition: A | Discharge status: Home / Self Care | Payer: Self-pay | Source: Ambulatory Visit | Attending: Obstetrics and Gynecology

## 2014-08-28 ENCOUNTER — Ambulatory Visit (HOSPITAL_COMMUNITY): Payer: BLUE CROSS/BLUE SHIELD | Admitting: Anesthesiology

## 2014-08-28 ENCOUNTER — Encounter (HOSPITAL_COMMUNITY): Payer: Self-pay | Admitting: *Deleted

## 2014-08-28 ENCOUNTER — Ambulatory Visit (HOSPITAL_COMMUNITY)
Admission: RE | Admit: 2014-08-28 | Discharge: 2014-08-28 | Disposition: A | Discharge status: Home / Self Care | Payer: BLUE CROSS/BLUE SHIELD | Source: Ambulatory Visit | Attending: Obstetrics and Gynecology | Admitting: Obstetrics and Gynecology

## 2014-08-28 DIAGNOSIS — Z302 Encounter for sterilization: Secondary | ICD-10-CM | POA: Diagnosis not present

## 2014-08-28 HISTORY — PX: LAPAROSCOPIC TUBAL LIGATION: SHX1937

## 2014-08-28 LAB — HCG, SERUM, QUALITATIVE: Preg, Serum: NEGATIVE

## 2014-08-28 SURGERY — LIGATION, FALLOPIAN TUBE, LAPAROSCOPIC
Anesthesia: General | Site: Abdomen | Laterality: Bilateral

## 2014-08-28 MED ORDER — HYDROCODONE-IBUPROFEN 7.5-200 MG PO TABS: 1.0000 | ORAL_TABLET | Freq: Three times a day (TID) | ORAL | Status: AC | PRN

## 2014-08-28 MED ORDER — ACETAMINOPHEN 160 MG/5ML PO SOLN
975.0000 mg | Freq: Once | ORAL | Status: AC
  Administered 2014-08-28: 975 mg via ORAL

## 2014-08-28 MED ORDER — ONDANSETRON HCL 4 MG/2ML IJ SOLN
INTRAMUSCULAR | Status: DC | PRN
  Administered 2014-08-28: 4 mg via INTRAVENOUS

## 2014-08-28 MED ORDER — MIDAZOLAM HCL 2 MG/2ML IJ SOLN
INTRAMUSCULAR | Status: AC
  Filled 2014-08-28: qty 2

## 2014-08-28 MED ORDER — NEOSTIGMINE METHYLSULFATE 10 MG/10ML IV SOLN
INTRAVENOUS | Status: AC
  Filled 2014-08-28: qty 1

## 2014-08-28 MED ORDER — ROCURONIUM BROMIDE 100 MG/10ML IV SOLN
INTRAVENOUS | Status: AC
  Filled 2014-08-28: qty 1

## 2014-08-28 MED ORDER — DEXAMETHASONE SODIUM PHOSPHATE 4 MG/ML IJ SOLN
INTRAMUSCULAR | Status: AC
  Filled 2014-08-28: qty 1

## 2014-08-28 MED ORDER — BUPIVACAINE HCL (PF) 0.25 % IJ SOLN
INTRAMUSCULAR | Status: DC | PRN
Start: 2014-08-28 — End: 2014-08-28
  Administered 2014-08-28: 10 mL

## 2014-08-28 MED ORDER — PROPOFOL 10 MG/ML IV BOLUS
INTRAVENOUS | Status: DC | PRN
  Administered 2014-08-28: 180 mg via INTRAVENOUS
  Administered 2014-08-28: 100 mg via INTRAVENOUS

## 2014-08-28 MED ORDER — GLYCOPYRROLATE 0.2 MG/ML IJ SOLN
INTRAMUSCULAR | Status: DC | PRN
  Administered 2014-08-28: 0.4 mg via INTRAVENOUS

## 2014-08-28 MED ORDER — LIDOCAINE HCL (CARDIAC) 20 MG/ML IV SOLN
INTRAVENOUS | Status: DC | PRN
  Administered 2014-08-28: 50 mg via INTRAVENOUS

## 2014-08-28 MED ORDER — ONDANSETRON HCL 4 MG/2ML IJ SOLN
INTRAMUSCULAR | Status: AC
  Filled 2014-08-28: qty 2

## 2014-08-28 MED ORDER — SUCCINYLCHOLINE CHLORIDE 20 MG/ML IJ SOLN
INTRAMUSCULAR | Status: AC
  Filled 2014-08-28: qty 10

## 2014-08-28 MED ORDER — PROPOFOL 10 MG/ML IV BOLUS
INTRAVENOUS | Status: AC
  Filled 2014-08-28: qty 20

## 2014-08-28 MED ORDER — NEOSTIGMINE METHYLSULFATE 10 MG/10ML IV SOLN
INTRAVENOUS | Status: DC | PRN
  Administered 2014-08-28: 2 mg via INTRAVENOUS

## 2014-08-28 MED ORDER — SCOPOLAMINE 1 MG/3DAYS TD PT72
MEDICATED_PATCH | TRANSDERMAL | Status: AC
  Administered 2014-08-28: 1.5 mg via TRANSDERMAL
  Filled 2014-08-28: qty 1

## 2014-08-28 MED ORDER — ROCURONIUM BROMIDE 100 MG/10ML IV SOLN
INTRAVENOUS | Status: DC | PRN
  Administered 2014-08-28: 10 mg via INTRAVENOUS

## 2014-08-28 MED ORDER — HYDROCODONE-ACETAMINOPHEN 5-325 MG PO TABS
1.0000 | ORAL_TABLET | Freq: Once | ORAL | Status: AC
  Administered 2014-08-28: 1 via ORAL

## 2014-08-28 MED ORDER — MIDAZOLAM HCL 2 MG/2ML IJ SOLN
INTRAMUSCULAR | Status: DC | PRN
  Administered 2014-08-28: 2 mg via INTRAVENOUS

## 2014-08-28 MED ORDER — FENTANYL CITRATE 0.05 MG/ML IJ SOLN: 25.0000 ug | INTRAMUSCULAR | Status: DC | PRN

## 2014-08-28 MED ORDER — ACETAMINOPHEN 160 MG/5ML PO SOLN
ORAL | Status: AC
  Filled 2014-08-28: qty 40.6

## 2014-08-28 MED ORDER — BUPIVACAINE HCL (PF) 0.25 % IJ SOLN
INTRAMUSCULAR | Status: AC
  Filled 2014-08-28: qty 30

## 2014-08-28 MED ORDER — FENTANYL CITRATE 0.05 MG/ML IJ SOLN
INTRAMUSCULAR | Status: AC
  Filled 2014-08-28: qty 5

## 2014-08-28 MED ORDER — HYDROCODONE-ACETAMINOPHEN 5-325 MG PO TABS
ORAL_TABLET | ORAL | Status: AC
  Filled 2014-08-28: qty 1

## 2014-08-28 MED ORDER — GLYCOPYRROLATE 0.2 MG/ML IJ SOLN
INTRAMUSCULAR | Status: AC
  Filled 2014-08-28: qty 2

## 2014-08-28 MED ORDER — DEXAMETHASONE SODIUM PHOSPHATE 10 MG/ML IJ SOLN
INTRAMUSCULAR | Status: DC | PRN
  Administered 2014-08-28: 4 mg via INTRAVENOUS

## 2014-08-28 MED ORDER — SCOPOLAMINE 1 MG/3DAYS TD PT72
1.0000 | MEDICATED_PATCH | Freq: Once | TRANSDERMAL | Status: DC
  Administered 2014-08-28: 1.5 mg via TRANSDERMAL

## 2014-08-28 MED ORDER — LACTATED RINGERS IV SOLN
INTRAVENOUS | Status: DC
  Administered 2014-08-28 (×3): via INTRAVENOUS

## 2014-08-28 MED ORDER — CEFAZOLIN SODIUM-DEXTROSE 2-3 GM-% IV SOLR
INTRAVENOUS | Status: AC
  Filled 2014-08-28: qty 50

## 2014-08-28 MED ORDER — FENTANYL CITRATE 0.05 MG/ML IJ SOLN
INTRAMUSCULAR | Status: DC | PRN
  Administered 2014-08-28: 50 ug via INTRAVENOUS
  Administered 2014-08-28: 100 ug via INTRAVENOUS

## 2014-08-28 MED ORDER — LIDOCAINE HCL (CARDIAC) 20 MG/ML IV SOLN
INTRAVENOUS | Status: AC
  Filled 2014-08-28: qty 5

## 2014-08-28 MED ORDER — KETOROLAC TROMETHAMINE 30 MG/ML IJ SOLN
INTRAMUSCULAR | Status: DC | PRN
  Administered 2014-08-28: 30 mg via INTRAVENOUS

## 2014-08-28 MED ORDER — KETOROLAC TROMETHAMINE 30 MG/ML IJ SOLN
INTRAMUSCULAR | Status: AC
  Filled 2014-08-28: qty 1

## 2014-08-28 SURGICAL SUPPLY — 17 items
CATH ROBINSON RED A/P 16FR (CATHETERS) ×3 IMPLANT
CLOTH BEACON ORANGE TIMEOUT ST (SAFETY) ×3 IMPLANT
DRSG COVADERM PLUS 2X2 (GAUZE/BANDAGES/DRESSINGS) ×6 IMPLANT
DRSG OPSITE POSTOP 3X4 (GAUZE/BANDAGES/DRESSINGS) ×3 IMPLANT
GLOVE BIO SURGEON STRL SZ7.5 (GLOVE) ×3 IMPLANT
GOWN STRL REUS W/TWL LRG LVL3 (GOWN DISPOSABLE) ×6 IMPLANT
LIQUID BAND (GAUZE/BANDAGES/DRESSINGS) ×3 IMPLANT
NEEDLE INSUFFLATION 120MM (ENDOMECHANICALS) ×3 IMPLANT
PACK LAPAROSCOPY BASIN (CUSTOM PROCEDURE TRAY) ×3 IMPLANT
PAD POSITIONER PINK NONSTERILE (MISCELLANEOUS) ×3 IMPLANT
SOLUTION ELECTROLUBE (MISCELLANEOUS) ×3 IMPLANT
SUT VICRYL 0 UR6 27IN ABS (SUTURE) ×3 IMPLANT
SUT VICRYL 4-0 PS2 18IN ABS (SUTURE) ×3 IMPLANT
TOWEL OR 17X24 6PK STRL BLUE (TOWEL DISPOSABLE) ×6 IMPLANT
TROCAR XCEL DIL TIP R 11M (ENDOMECHANICALS) ×3 IMPLANT
WARMER LAPAROSCOPE (MISCELLANEOUS) ×3 IMPLANT
WATER STERILE IRR 1000ML POUR (IV SOLUTION) ×3 IMPLANT

## 2014-08-28 NOTE — Anesthesia Preprocedure Evaluation (Signed)

## 2014-08-28 NOTE — Anesthesia Postprocedure Evaluation (Signed)
  Anesthesia Post-op Note  Patient: [name]  Procedure(s) Performed: Procedure(s): LAPAROSCOPIC BILATERAL TUBAL LIGATION (Bilateral) Patient is awake and responsive. Pain and nausea are reasonably well controlled. Vital signs are stable and clinically acceptable. Oxygen saturation is clinically acceptable. There are no apparent anesthetic complications at this time. Patient is ready for discharge.

## 2014-08-28 NOTE — Op Note (Signed)
08/28/2014  1:43 PM  PATIENT:  [name]  34 y.o. female  PRE-OPERATIVE DIAGNOSIS:  Desires Sterilization  POST-OPERATIVE DIAGNOSIS:  desires sterilization  PROCEDURE:  Procedure(s): LAPAROSCOPIC BILATERAL TUBAL LIGATION  SURGEON:  Surgeon(s): Olivia Mackieichard Tonique Mendonca, MD  ASSISTANTS: none   ANESTHESIA:   local and general  ESTIMATED BLOOD LOSS: * No blood loss amount entered *   DRAINS: none   LOCAL MEDICATIONS USED:  MARCAINE    and Amount: 20 ml  SPECIMEN:  No Specimen  DISPOSITION OF SPECIMEN:  N/A  COUNTS:  YES  DICTATION #: [id number]  PLAN OF CARE: DC home  PATIENT DISPOSITION:  PACU - hemodynamically stable.

## 2014-08-28 NOTE — Addendum Note (Signed)
Addendum  created 08/28/14 1535 by Graciela HusbandsWynn O Kasandra Fehr, CRNA   Modules edited: Notes Section   Notes Section:  Delete: [id number]

## 2014-08-28 NOTE — Transfer of Care (Deleted)
Immediate Anesthesia Transfer of Care Note  Patient: [name]  Procedure(s) Performed: Procedure(s): LAPAROSCOPIC BILATERAL TUBAL LIGATION (Bilateral)  Patient Location: PACU  Anesthesia Type:Epidural  Level of Consciousness: awake, alert  and oriented  Airway & Oxygen Therapy: Patient Spontanous Breathing  Post-op Assessment: Report given to RN and Post -op Vital signs reviewed and stable  Post vital signs: Reviewed and stable  Last Vitals:  Filed Vitals:   08/28/14 1500  BP: 107/69  Pulse: 58  Temp: 36.3 C  Resp: 20    Complications: No apparent anesthesia complications

## 2014-08-28 NOTE — Discharge Instructions (Signed)

## 2014-08-28 NOTE — Transfer of Care (Signed)
Immediate Anesthesia Transfer of Care Note  Patient: [name] L Pressman  Procedure(s) Performed: Procedure(s): LAPAROSCOPIC BILATERAL TUBAL LIGATION (Bilateral)  Patient Location: PACU  Anesthesia Type:General  Level of Consciousness: awake  Airway & Oxygen Therapy: Patient Spontanous Breathing  Post-op Assessment: Report given to PACU RN  Post vital signs: stable  Filed Vitals:   08/28/14 1148  BP: 110/74  Pulse: 58  Temp: 36.8 C  Resp: 18    Complications: No apparent anesthesia complications

## 2014-08-28 NOTE — Progress Notes (Signed)
Patient ID: Debbie BailiffCassie L [name], female   DOB: 05/10/1981, 34 y.o.   MRN: [id number] Patient seen and examined. Consent witnessed and signed. No changes noted. Update completed.

## 2014-08-29 NOTE — Op Note (Signed)
Debbie Ferrier:  [name], [name]               ACCOUNT NO.:  [id number]  MEDICAL RECORD NO.:  [id number]  LOCATION:  [facility]                          [facility]  PHYSICIAN:  Lenoard Adenichard J. Nobie Alleyne, M.D.DATE OF BIRTH:  1981-03-21  DATE OF PROCEDURE: DATE OF DISCHARGE:  08/28/2014                              OPERATIVE REPORT   PREOPERATIVE DIAGNOSIS:  Desire for elective sterilization.  POSTOPERATIVE DIAGNOSIS:  Desire for elective sterilization.  PROCEDURE:  Laparoscopic tubal sterilization.  SURGEON:  Lenoard Adenichard J. Zanden Colver, M.D.  ASSISTANT:  None.  ANESTHESIA:  General local.  ESTIMATED BLOOD LOSS:  Less than 50 mL.  COMPLICATIONS:  None.  DRAINS:  None.  COUNTS:  Correct.  DISPOSITION:  The patient to recovery in good condition.  BRIEF OPERATIVE NOTE:  After being apprised of risks of anesthesia, infection, bleeding, injury to surrounding organs, possible need for repair, failure risk of tubal ligation of 5-02/999, the patient was brought to the operating room where she was administered general anesthetic without complications.  Prepped and draped in usual sterile fashion.  Catheterized until the bladder was empty.  Hulka tenaculum placed without difficulty.  At this time, an infraumbilical incision made with a scalpel.  Veress needle placed opening pressure of -2, 4 L CO2 insufflated without difficulty.  Atraumatic placement 10 mm trocar. Operative scope placed.  Visualization reveals a normal anterior- posterior cul-de-sac.  Normal liver, gallbladder bed.  Normal appendiceal area.  Normal tubes, normal ovaries.  Kleppinger bipolar cautery was entered through the operative port, 3 contiguous areas of the left tube were cauterized using bipolar cautery to resistance of 0. Tube was then separated using hook scissors.  Tubal lumens were visualized.  Similar procedure done on the right tube.  Both tubal lumens visualized.  Good hemostasis noted. CO2 released without difficulty.   Positive pressure applied.  All instruments removed under direct visualization.  Incision closed with 0 Vicryl, 4-0 Vicryl, Dermabond.  Instruments removed from the vagina. The patient tolerated the procedure well, transferred to recovery in good condition.     Lenoard Adenichard J. Danaria Larsen, M.D.     RJT/MEDQ  D:  08/28/2014  T:  08/29/2014  Job:  [id number]

## 2014-08-31 ENCOUNTER — Encounter (HOSPITAL_COMMUNITY): Payer: Self-pay | Admitting: Obstetrics and Gynecology

## 2014-12-30 IMAGING — US US OB COMP +14 WK
1 series · 12 of 28 positions shown · non-contrast
Comparison: none

[Series 1: us ob comp +14 wk · 62 acquisitions, 12 frames shown]
[im 3/62]
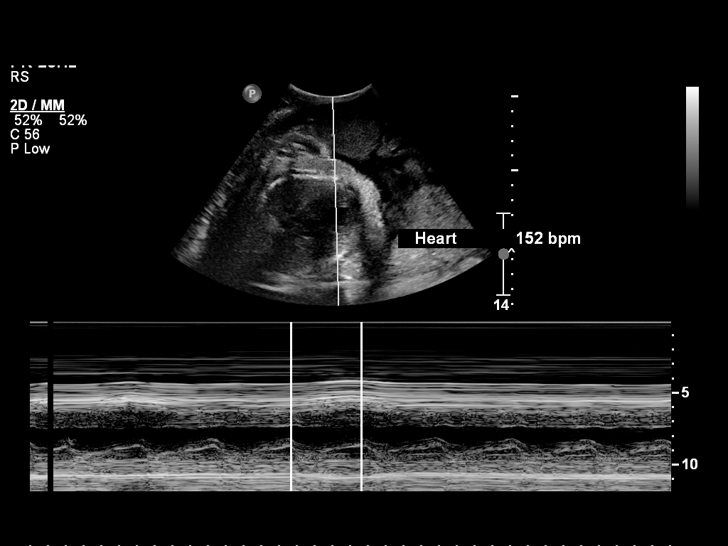
[im 7/62]
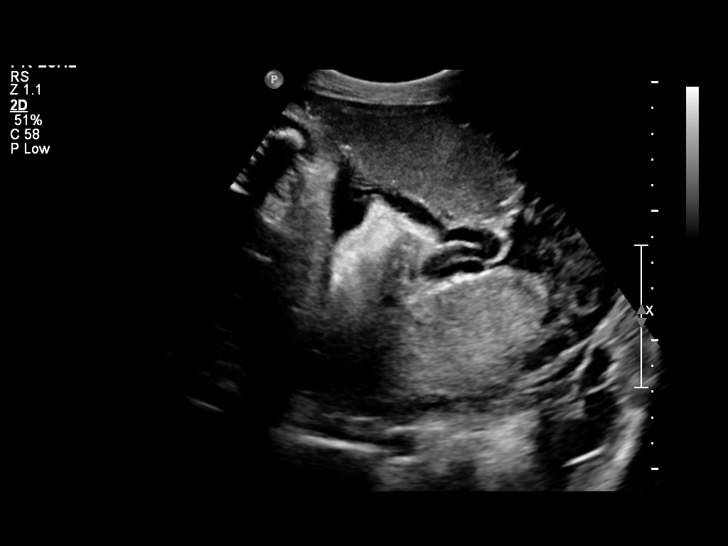
[im 12/62]
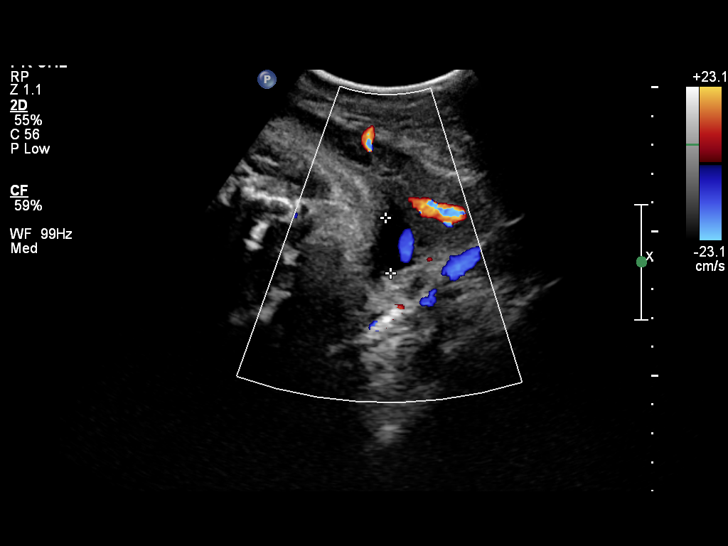
[im 19/62]
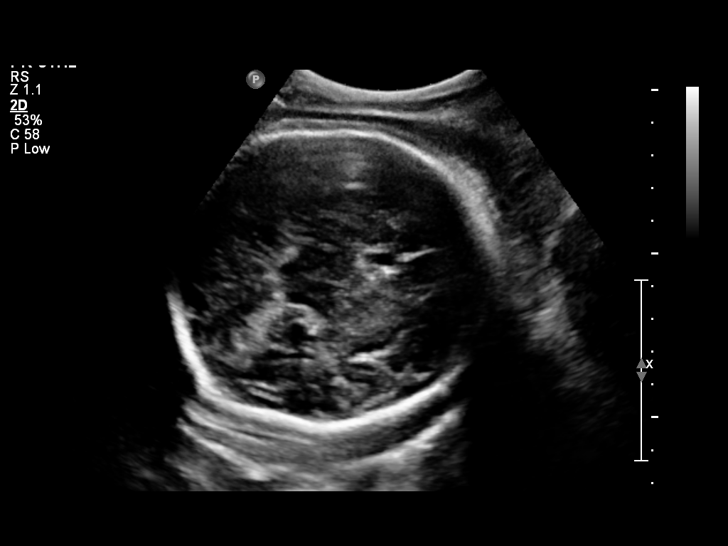
[im 23/62]
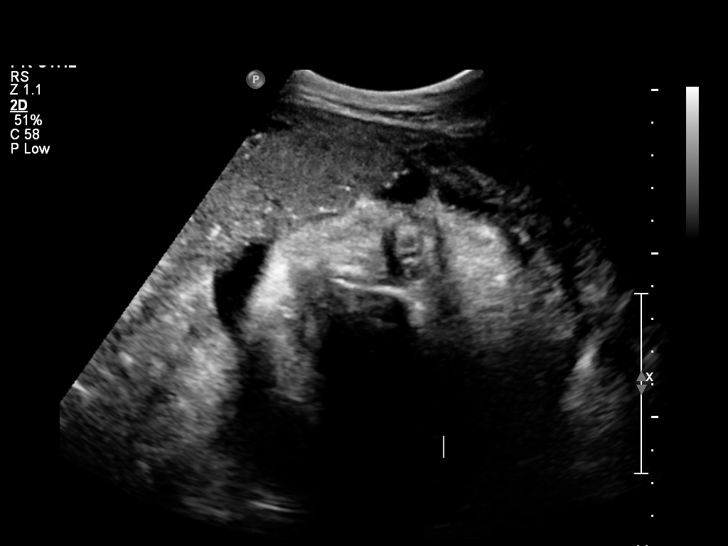
[im 28/62]
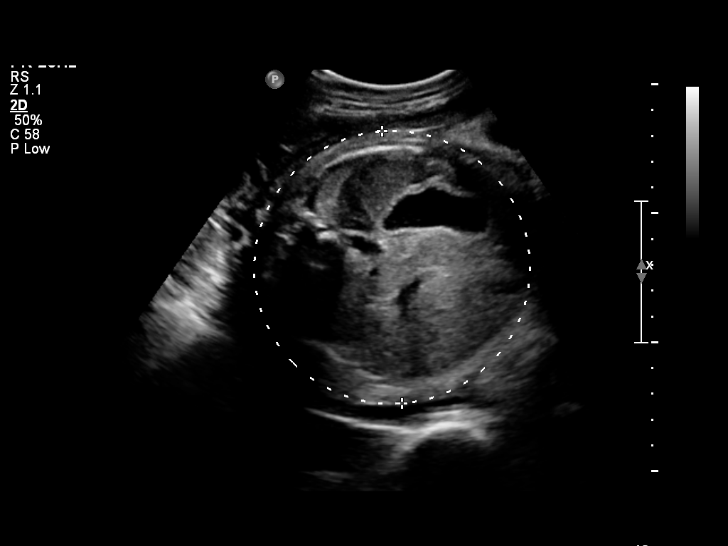
[im 34/62]
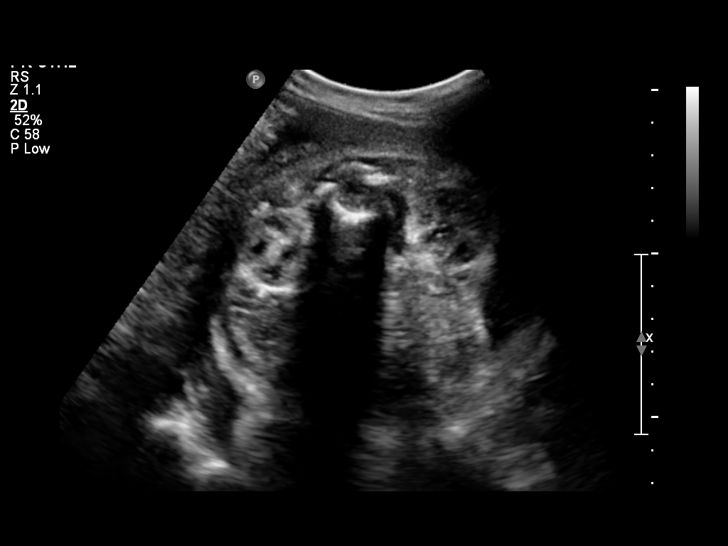
[im 39/62]
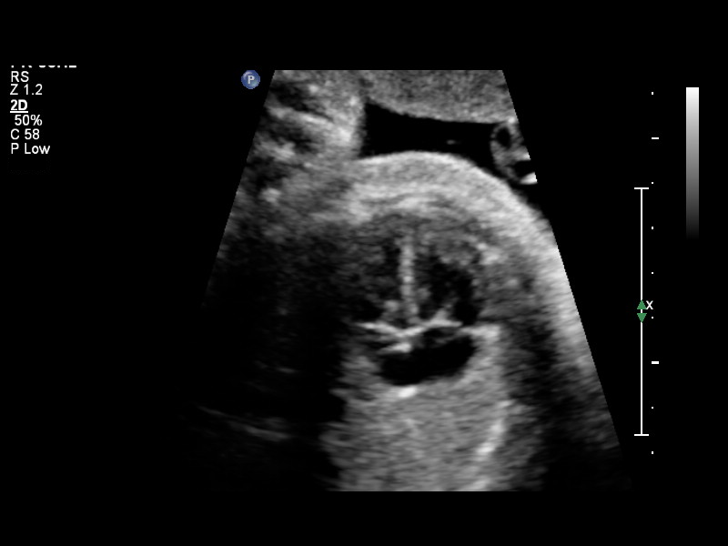
[im 43/62]
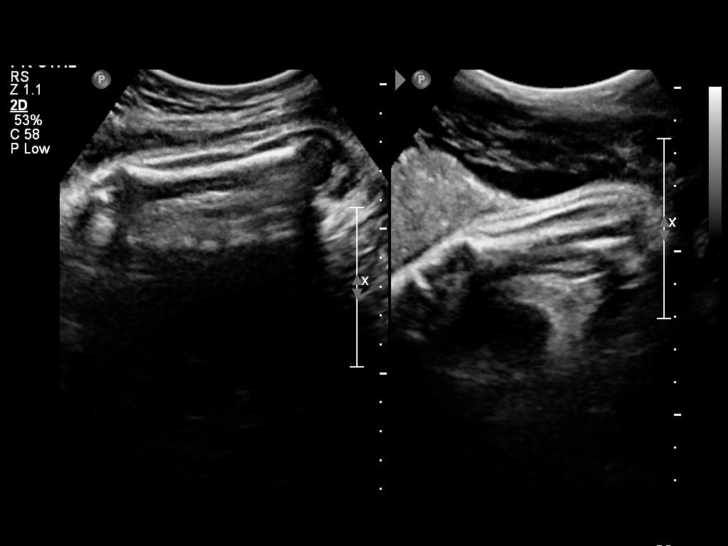
[im 50/62]
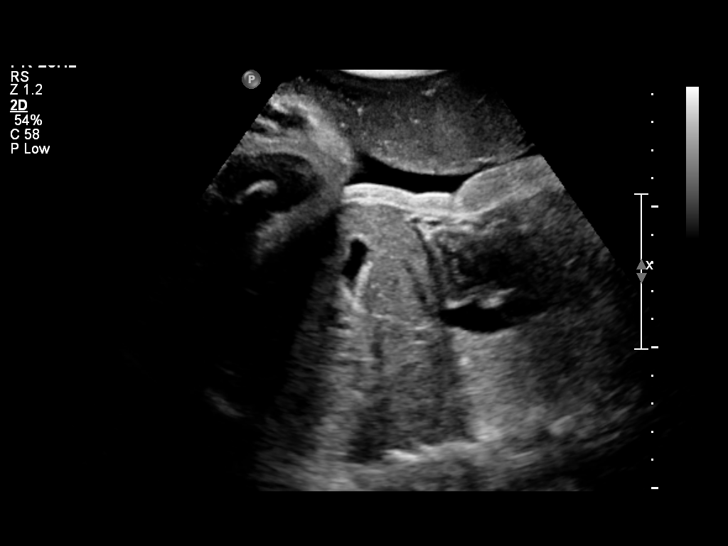
[im 55/62]
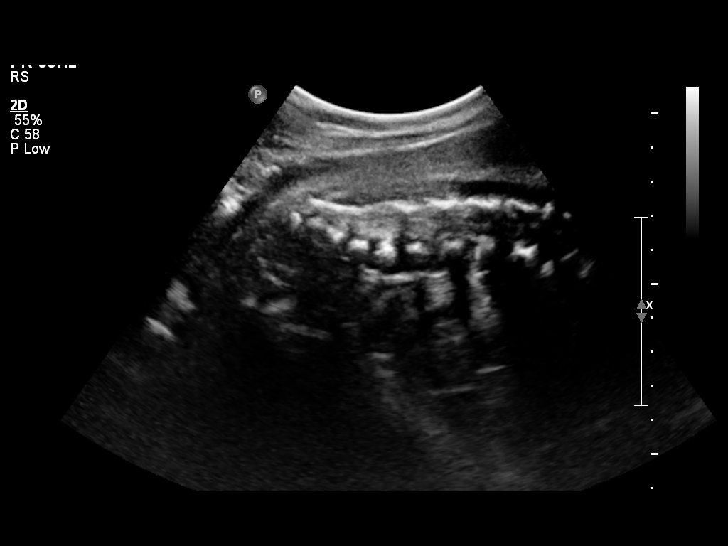
[im 59/62]
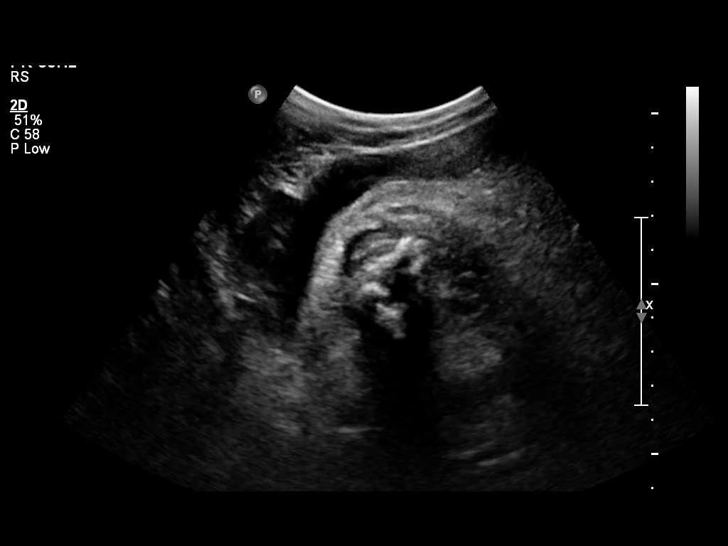

[12 of 28 positions shown; findings below may reference images not displayed]

OBSTETRICS REPORT
                      (Signed Final 12/15/2013 [DATE])

Service(s) Provided

 US OB COMP + 14 WK                                    76805.1
Indications

 Premature rupture of membranes - leaking fluid
 (PPROM)
 Assess fetal growth
 Basic anatomic survey
 Previous cervical surgery (cone biopsy)
 Rh negative
 Vaginal bleeding, unknown etiology (spotting)
Fetal Evaluation

 Num Of Fetuses:    1
 Fetal Heart Rate:  152                          bpm
 Cardiac Activity:  Observed
 Presentation:      Cephalic
 Placenta:          Anterior Left, above
                    cervical os
 P. Cord            Visualized
 Insertion:

 Amniotic Fluid
 AFI FV:      Subjectively low-normal
 AFI Sum:     11.4    cm       29  %Tile     Larg Pckt:    4.37  cm
 RUQ:   4.37    cm   RLQ:    1.92   cm    LUQ:   3.83    cm   LLQ:    1.28   cm
Biometry

 BPD:     89.9  mm     G. Age:  36w 3d                CI:        78.08   70 - 86
                                                      FL/HC:      20.8   19.4 -

 HC:     321.9  mm     G. Age:  36w 3d       77  %    HC/AC:      0.97   0.96 -

 AC:     330.8  mm     G. Age:  37w 0d     > 97  %    FL/BPD:     74.3   71 - 87
 FL:      66.8  mm     G. Age:  34w 3d       53  %    FL/AC:      20.2   20 - 24
 HUM:     59.1  mm     G. Age:  34w 1d       66  %

 Est. FW:    6766  gm      6 lb 5 oz   > 90  %
Gestational Age

 Clinical EDD:  33w 6d                                        EDD:   01/26/14
 U/S Today:     36w 1d                                        EDD:   01/10/14
 Best:          33w 6d     Det. By:  Clinical EDD             EDD:   01/26/14
Anatomy

 Cranium:          Appears normal         Aortic Arch:      Not well visualized
 Fetal Cavum:      Appears normal         Ductal Arch:      Not well visualized
 Ventricles:       Appears normal         Diaphragm:        Appears normal
 Choroid Plexus:   Appears normal         Stomach:          Appears normal, left
                                                            sided
 Cerebellum:       Not well visualized    Abdomen:          Appears normal
 Posterior Fossa:  Not well visualized    Abdominal Wall:   Not well visualized
 Nuchal Fold:      Not applicable (>20    Cord Vessels:     Appears normal (3
                   wks GA)                                  vessel cord)
 Face:             Not well visualized    Kidneys:          Appear normal
 Lips:             Appears normal         Bladder:          Appears normal
 Heart:            Appears normal         Spine:            Limited views
                   (4CH, axis, and                          appear normal
                   situs)
 RVOT:             Not well visualized    Lower             Present
                                          Extremities:
 LVOT:             Not well visualized    Upper             Present
                                          Extremities:

 Other:  Fetus appears to be a male. Technically difficult due to advanced
         gestational age.
Targeted Anatomy

 Fetal Central Nervous System
 Lat. Ventricles:
Cervix Uterus Adnexa

 Cervix:       Not visualized (advanced GA >99wks)
 Left Ovary:    Not visualized.
 Right Ovary:   Size(cm) L: 3.5 x W: 2.36 x H: 2.44  Volume(cc):

 Adnexa:     No abnormality visualized.
Impression

 SIUP at 33+6 weeks
 PPROM; Cephalic presentation
 Normal but limited detailed fetal anatomy; no gross
 abnormaltiies identified
 Normal amniotic fluid volume
 Measurements consistent with stated EDC (2 weeks ahead);
 EFW > 90th %tile; AC > 97th %tile; fetus at risk to be LGA
Recommendations

 Follow-up as clinically indicated

 questions or concerns.

## 2017-02-20 DIAGNOSIS — N6002 Solitary cyst of left breast: Secondary | ICD-10-CM | POA: Diagnosis not present

## 2017-02-20 DIAGNOSIS — N6321 Unspecified lump in the left breast, upper outer quadrant: Secondary | ICD-10-CM | POA: Diagnosis not present

## 2017-07-10 DIAGNOSIS — Z13220 Encounter for screening for lipoid disorders: Secondary | ICD-10-CM | POA: Diagnosis not present

## 2017-07-10 DIAGNOSIS — Z1329 Encounter for screening for other suspected endocrine disorder: Secondary | ICD-10-CM | POA: Diagnosis not present

## 2017-07-10 DIAGNOSIS — Z6823 Body mass index (BMI) 23.0-23.9, adult: Secondary | ICD-10-CM | POA: Diagnosis not present

## 2017-07-10 DIAGNOSIS — Z01419 Encounter for gynecological examination (general) (routine) without abnormal findings: Secondary | ICD-10-CM | POA: Diagnosis not present

## 2017-07-10 DIAGNOSIS — Z0000 Encounter for general adult medical examination without abnormal findings: Secondary | ICD-10-CM | POA: Diagnosis not present

## 2017-07-12 DIAGNOSIS — N6001 Solitary cyst of right breast: Secondary | ICD-10-CM | POA: Diagnosis not present

## 2017-08-08 DIAGNOSIS — E875 Hyperkalemia: Secondary | ICD-10-CM | POA: Diagnosis not present

## 2018-10-01 DIAGNOSIS — Z124 Encounter for screening for malignant neoplasm of cervix: Secondary | ICD-10-CM | POA: Diagnosis not present

## 2018-10-01 DIAGNOSIS — Z113 Encounter for screening for infections with a predominantly sexual mode of transmission: Secondary | ICD-10-CM | POA: Diagnosis not present

## 2018-10-01 DIAGNOSIS — Z6823 Body mass index (BMI) 23.0-23.9, adult: Secondary | ICD-10-CM | POA: Diagnosis not present

## 2018-10-01 DIAGNOSIS — Z01419 Encounter for gynecological examination (general) (routine) without abnormal findings: Secondary | ICD-10-CM | POA: Diagnosis not present

## 2018-10-01 DIAGNOSIS — Z1151 Encounter for screening for human papillomavirus (HPV): Secondary | ICD-10-CM | POA: Diagnosis not present

## 2018-10-01 DIAGNOSIS — D069 Carcinoma in situ of cervix, unspecified: Secondary | ICD-10-CM | POA: Diagnosis not present

## 2019-04-06 DIAGNOSIS — Z03818 Encounter for observation for suspected exposure to other biological agents ruled out: Secondary | ICD-10-CM | POA: Diagnosis not present

## 2019-04-06 DIAGNOSIS — Z20828 Contact with and (suspected) exposure to other viral communicable diseases: Secondary | ICD-10-CM | POA: Diagnosis not present

## 2019-04-22 DIAGNOSIS — D1801 Hemangioma of skin and subcutaneous tissue: Secondary | ICD-10-CM | POA: Diagnosis not present

## 2019-07-15 ENCOUNTER — Ambulatory Visit: Payer: BC Managed Care – PPO | Attending: Internal Medicine

## 2019-07-15 DIAGNOSIS — Z20822 Contact with and (suspected) exposure to covid-19: Secondary | ICD-10-CM

## 2019-07-16 LAB — NOVEL CORONAVIRUS, NAA: SARS-CoV-2, NAA: DETECTED — AB

## 2019-07-17 ENCOUNTER — Telehealth (HOSPITAL_COMMUNITY): Payer: Self-pay | Admitting: Nurse Practitioner

## 2019-07-17 NOTE — Telephone Encounter (Signed)
Called to discuss with Molinda Bailiff about Covid symptoms and the use of bamlanivimab, a monoclonal antibody infusion for those with mild to moderate Covid symptoms and at a high risk of hospitalization.     Pt is not qualified for this infusion due to lack of identified risk factors and co-morbid conditions based on chart review. Left message to discuss symptoms as well as review criteria for ending isolation, and ER precautions as well as preventative practices.    Patient Active Problem List   Diagnosis Date Noted  . Wellness examination 08/14/2014  . Postpartum care following vaginal delivery (7/20) 12/15/2013  . Preterm premature rupture of membranes (PPROM) delivered, current hospitalization 12/14/2013  . Cervical shortening in pregnancy in second trimester 10/19/2013    Consuello Masse, DNP, AGNP-C [facility]  [email or web address] Main Line: [phone]  [phone] (Infusion Center Hotline)

## 2019-11-18 DIAGNOSIS — Z01419 Encounter for gynecological examination (general) (routine) without abnormal findings: Secondary | ICD-10-CM | POA: Diagnosis not present

## 2019-11-18 DIAGNOSIS — Z124 Encounter for screening for malignant neoplasm of cervix: Secondary | ICD-10-CM | POA: Diagnosis not present

## 2019-11-18 DIAGNOSIS — D069 Carcinoma in situ of cervix, unspecified: Secondary | ICD-10-CM | POA: Diagnosis not present

## 2019-11-18 DIAGNOSIS — Z130 Encounter for screening for diseases of the blood and blood-forming organs and certain disorders involving the immune mechanism: Secondary | ICD-10-CM | POA: Diagnosis not present

## 2019-11-18 DIAGNOSIS — Z1329 Encounter for screening for other suspected endocrine disorder: Secondary | ICD-10-CM | POA: Diagnosis not present

## 2019-11-18 DIAGNOSIS — Z6822 Body mass index (BMI) 22.0-22.9, adult: Secondary | ICD-10-CM | POA: Diagnosis not present

## 2019-11-18 DIAGNOSIS — Z1151 Encounter for screening for human papillomavirus (HPV): Secondary | ICD-10-CM | POA: Diagnosis not present

## 2019-11-18 DIAGNOSIS — Z13220 Encounter for screening for lipoid disorders: Secondary | ICD-10-CM | POA: Diagnosis not present

## 2019-11-18 DIAGNOSIS — Z0000 Encounter for general adult medical examination without abnormal findings: Secondary | ICD-10-CM | POA: Diagnosis not present

## 2019-12-19 DIAGNOSIS — D485 Neoplasm of uncertain behavior of skin: Secondary | ICD-10-CM | POA: Diagnosis not present

## 2019-12-19 DIAGNOSIS — L820 Inflamed seborrheic keratosis: Secondary | ICD-10-CM | POA: Diagnosis not present

## 2019-12-19 DIAGNOSIS — D225 Melanocytic nevi of trunk: Secondary | ICD-10-CM | POA: Diagnosis not present

## 2019-12-19 DIAGNOSIS — L710 Perioral dermatitis: Secondary | ICD-10-CM | POA: Diagnosis not present

## 2020-01-15 DIAGNOSIS — N6452 Nipple discharge: Secondary | ICD-10-CM | POA: Diagnosis not present

## 2020-01-15 DIAGNOSIS — Z1329 Encounter for screening for other suspected endocrine disorder: Secondary | ICD-10-CM | POA: Diagnosis not present

## 2023-04-05 ENCOUNTER — Other Ambulatory Visit: Payer: Self-pay

## 2023-04-06 LAB — SURGICAL PATHOLOGY
